# Patient Record
Sex: Female | Born: 1990 | Hispanic: Yes | Marital: Single | Smoking: Never smoker
Health system: Southern US, Community
[De-identification: ages and names within clinical notes are randomized; demographics above are authoritative.]

## PROBLEM LIST (undated history)

## (undated) DIAGNOSIS — O24419 Gestational diabetes mellitus in pregnancy, unspecified control: Secondary | ICD-10-CM

## (undated) DIAGNOSIS — K2970 Gastritis, unspecified, without bleeding: Secondary | ICD-10-CM

## (undated) DIAGNOSIS — Z789 Other specified health status: Secondary | ICD-10-CM

## (undated) HISTORY — PX: NO PAST SURGERIES: SHX2092

## (undated) HISTORY — DX: Gestational diabetes mellitus in pregnancy, unspecified control: O24.419

## (undated) HISTORY — DX: Other specified health status: Z78.9

---

## 2021-12-18 NOTE — L&D Delivery Note (Signed)
OB/GYN Faculty Practice Delivery Note  Kiara Webster is a 31 y.o. G1P1001 s/p SVD at [redacted]w[redacted]d. She was admitted for IOL due to A2GDM.   ROM: 23h 29m with clear fluid GBS Status: Negative  Delivery Date/Time: 05/28/22 at 1813   Delivery: Called to room and patient was complete and pushing. Head delivered LOA. No nuchal cord present. Posterior axilla grasped and rotated counterclockwise. Shoulders and body delivered in usual fashion. Infant initially with poor tone and respiratory effort. Cord clamped x 2 and cut immediately to allow for further resuscitation by NICU team at warmer. Cord blood and cord gases drawn. Placenta delivered spontaneously with gentle cord traction. Fundus firm with massage and Pitocin. Labia, perineum, vagina, and cervix were inspected, and patient was found to have a small 1st degree perineal laceration that was hemostatic and not repaired.   Placenta: Intact, 3 VC - sent to L&D Complications: Chorioamnionitis (treated with Amp/Gent) Lacerations: 1st degree perineal  EBL: 105 cc Analgesia: Epidural   Arterial cord gas: pH 7.19 Venous cord gas: pH 7.27  Infant: Viable female  APGARs 8 and 58  Vilma Meckel, MD OB/GYN Fellow, Faculty Practice

## 2022-01-03 ENCOUNTER — Encounter: Payer: Self-pay | Admitting: Obstetrics

## 2022-01-03 ENCOUNTER — Other Ambulatory Visit (HOSPITAL_COMMUNITY)
Admission: RE | Admit: 2022-01-03 | Discharge: 2022-01-03 | Disposition: A | Discharge status: Home / Self Care | Payer: Medicaid Other | Source: Ambulatory Visit | Attending: Obstetrics | Admitting: Obstetrics

## 2022-01-03 ENCOUNTER — Ambulatory Visit (INDEPENDENT_AMBULATORY_CARE_PROVIDER_SITE_OTHER): Payer: Medicaid Other | Admitting: Obstetrics

## 2022-01-03 ENCOUNTER — Other Ambulatory Visit: Payer: Self-pay

## 2022-01-03 VITALS — BP 105/67 | HR 106 | Wt 219.0 lb

## 2022-01-03 DIAGNOSIS — O0990 Supervision of high risk pregnancy, unspecified, unspecified trimester: Secondary | ICD-10-CM | POA: Insufficient documentation

## 2022-01-03 DIAGNOSIS — Z3A18 18 weeks gestation of pregnancy: Secondary | ICD-10-CM

## 2022-01-03 DIAGNOSIS — O99210 Obesity complicating pregnancy, unspecified trimester: Secondary | ICD-10-CM

## 2022-01-03 DIAGNOSIS — Z3480 Encounter for supervision of other normal pregnancy, unspecified trimester: Secondary | ICD-10-CM | POA: Insufficient documentation

## 2022-01-03 DIAGNOSIS — K219 Gastro-esophageal reflux disease without esophagitis: Secondary | ICD-10-CM

## 2022-01-03 MED ORDER — PANTOPRAZOLE SODIUM 40 MG PO TBEC: 40.0000 mg | DELAYED_RELEASE_TABLET | Freq: Every day | ORAL | 5 refills | Status: DC

## 2022-01-03 MED ORDER — VITAFOL ULTRA 29-0.6-0.4-200 MG PO CAPS: 1.0000 | ORAL_CAPSULE | Freq: Every day | ORAL | 4 refills | Status: DC

## 2022-01-03 NOTE — Progress Notes (Signed)
Subjective:    Kiara Webster is being seen today for her first obstetrical visit.  This is a planned pregnancy. She is at [redacted]w[redacted]d gestation. Her obstetrical history is significant for obesity. Relationship with FOB: significant other, living together. Patient does intend to breast feed. Pregnancy history fully reviewed.  The information documented in the HPI was reviewed and verified.  Menstrual History: OB History     Gravida  1   Para      Term      Preterm      AB      Living         SAB      IAB      Ectopic      Multiple      Live Births               Patient's last menstrual period was 08/27/2021.    History reviewed. No pertinent past medical history.  History reviewed. No pertinent surgical history.  (Not in a hospital admission)  Not on File  Social History   Tobacco Use   Smoking status: Never   Smokeless tobacco: Never  Substance Use Topics   Alcohol use: Not Currently    History reviewed. No pertinent family history.   Review of Systems Constitutional: negative for weight loss Gastrointestinal: negative for vomiting Genitourinary:negative for genital lesions and vaginal discharge and dysuria Musculoskeletal:negative for back pain Behavioral/Psych: negative for abusive relationship, depression, illegal drug usage and tobacco use    Objective:    BP 105/67    Pulse (!) 106    Wt 219 lb (99.3 kg)    LMP 08/27/2021  General Appearance:    Alert, cooperative, no distress, appears stated age  Head:    Normocephalic, without obvious abnormality, atraumatic  Eyes:    PERRL, conjunctiva/corneas clear, EOM's intact, fundi    benign, both eyes  Ears:    Normal TM's and external ear canals, both ears  Nose:   Nares normal, septum midline, mucosa normal, no drainage    or sinus tenderness  Throat:   Lips, mucosa, and tongue normal; teeth and gums normal  Neck:   Supple, symmetrical, trachea midline, no adenopathy;    thyroid:  no  enlargement/tenderness/nodules; no carotid   bruit or JVD  Back:     Symmetric, no curvature, ROM normal, no CVA tenderness  Lungs:     Clear to auscultation bilaterally, respirations unlabored  Chest Wall:    No tenderness or deformity   Heart:    Regular rate and rhythm, S1 and S2 normal, no murmur, rub   or gallop  Breast Exam:    No tenderness, masses, or nipple abnormality  Abdomen:     Soft, non-tender, bowel sounds active all four quadrants,    no masses, no organomegaly  Genitalia:    Normal female without lesion, discharge or tenderness  Extremities:   Extremities normal, atraumatic, no cyanosis or edema  Pulses:   2+ and symmetric all extremities  Skin:   Skin color, texture, turgor normal, no rashes or lesions  Lymph nodes:   Cervical, supraclavicular, and axillary nodes normal  Neurologic:   CNII-XII intact, normal strength, sensation and reflexes    throughout      Lab Review Urine pregnancy test Labs reviewed yes Radiologic studies reviewed no  Assessment:    Pregnancy at [redacted]w[redacted]d weeks    Plan:    1. Supervision of other normal pregnancy, antepartum Rx: - Obstetric Panel, Including HIV -  Hepatitis C antibody - Culture, OB Urine - Cytology - PAP( [place]) - Cervicovaginal ancillary only( ) - Genetic Screening - Prenat-Fe Poly-Methfol-FA-DHA (VITAFOL ULTRA) 29-0.6-0.4-200 MG CAPS; Take 1 capsule by mouth daily before breakfast.  Dispense: 90 capsule; Refill: 4 - AFP, Serum, Open Spina Bifida - [place] MFM OB COMP + 14 WK; Future  2. GERD without esophagitis Rx: - pantoprazole (PROTONIX) 40 MG tablet; Take 1 tablet (40 mg total) by mouth daily.  Dispense: 30 tablet; Refill: 5  3. Obesity affecting pregnancy, antepartum    Prenatal vitamins.  Counseling provided regarding continued use of seat belts, cessation of alcohol consumption, smoking or use of illicit drugs; infection precautions i.e., influenza/TDAP immunizations, toxoplasmosis,CMV,  parvovirus, listeria and varicella; workplace safety, exercise during pregnancy; routine dental care, safe medications, sexual activity, hot tubs, saunas, pools, travel, caffeine use, fish and methlymercury, potential toxins, hair treatments, varicose veins Weight gain recommendations per IOM guidelines reviewed: underweight/BMI< 18.5--> gain 28 - 40 lbs; normal weight/BMI 18.5 - 24.9--> gain 25 - 35 lbs; overweight/BMI 25 - 29.9--> gain 15 - 25 lbs; obese/BMI >30->gain  11 - 20 lbs Problem list reviewed and updated. FIRST/CF mutation testing/NIPT/QUAD SCREEN/fragile X/Ashkenazi Jewish population testing/Spinal muscular atrophy discussed: requested. Role of ultrasound in pregnancy discussed; fetal survey: requested. Amniocentesis discussed: not indicated.  Meds ordered this encounter  Medications   Prenat-Fe Poly-Methfol-FA-DHA (VITAFOL ULTRA) 29-0.6-0.4-200 MG CAPS    Sig: Take 1 capsule by mouth daily before breakfast.    Dispense:  90 capsule    Refill:  4   pantoprazole (PROTONIX) 40 MG tablet    Sig: Take 1 tablet (40 mg total) by mouth daily.    Dispense:  30 tablet    Refill:  5   Orders Placed This Encounter  Procedures   Culture, OB Urine   [place] MFM OB COMP + 14 WK    Standing Status:   Future    Standing Expiration Date:   01/03/2023    Order Specific Question:   Reason for Exam (SYMPTOM  OR DIAGNOSIS REQUIRED)    Answer:   anatomy    Order Specific Question:   Preferred Location    Answer:   [facility] Ul[facility]ound   Obstetric Panel, Including HIV   Hepatitis C antibody   Genetic Screening   AFP, Serum, Open Spina Bifida    Order Specific Question:   Is patient insulin dependent?    Answer:   No    Order Specific Question:   Weight (lbs)    Answer:   219    Order Specific Question:   Gestational Age (GA), weeks    Answer:   18.3    Order Specific Question:   Date on which patient was at this [place]    Answer:   01/03/2022    Order Specific Question:   GA Calculation Method     Answer:   LMP    Order Specific Question:   GA Date    Answer:   06/03/2022    Order Specific Question:   Number of fetuses    Answer:   1    Order Specific Question:   Reason for screen    Answer:   OTHER    Follow up in 4 weeks.  I have spent a total of 20 minutes of face-to-face time, excluding clinical staff time, reviewing notes and preparing to see patient, ordering tests and/or medications, and counseling the patient.    Shelly Bombard, MD 01/03/2022 12:22 PM

## 2022-01-03 NOTE — Progress Notes (Signed)
Pt state she has developed a rash with itching since pregnancy. Pt is also having a lot of headaches.   Pt complains of reflux at night.

## 2022-01-04 LAB — CERVICOVAGINAL ANCILLARY ONLY
Bacterial Vaginitis (gardnerella): POSITIVE — AB
Candida Glabrata: NEGATIVE
Candida Vaginitis: NEGATIVE
Chlamydia: NEGATIVE
Comment: NEGATIVE
Comment: NEGATIVE
Comment: NEGATIVE
Comment: NEGATIVE
Comment: NEGATIVE
Comment: NORMAL
Neisseria Gonorrhea: NEGATIVE
Trichomonas: NEGATIVE

## 2022-01-04 LAB — CYTOLOGY - PAP: Diagnosis: NEGATIVE

## 2022-01-05 ENCOUNTER — Other Ambulatory Visit: Payer: Self-pay | Admitting: Obstetrics

## 2022-01-05 DIAGNOSIS — B9689 Other specified bacterial agents as the cause of diseases classified elsewhere: Secondary | ICD-10-CM

## 2022-01-05 LAB — OBSTETRIC PANEL, INCLUDING HIV
Antibody Screen: NEGATIVE
Basophils Absolute: 0.1 10*3/uL (ref 0.0–0.2)
Basos: 1 %
EOS (ABSOLUTE): 0.1 10*3/uL (ref 0.0–0.4)
Eos: 1 %
HIV Screen 4th Generation wRfx: NONREACTIVE
Hematocrit: 38.9 % (ref 34.0–46.6)
Hemoglobin: 12.9 g/dL (ref 11.1–15.9)
Hepatitis B Surface Ag: NEGATIVE
Immature Grans (Abs): 0.1 10*3/uL (ref 0.0–0.1)
Immature Granulocytes: 1 %
Lymphocytes Absolute: 2 10*3/uL (ref 0.7–3.1)
Lymphs: 19 %
MCH: 26.8 pg (ref 26.6–33.0)
MCHC: 33.2 g/dL (ref 31.5–35.7)
MCV: 81 fL (ref 79–97)
Monocytes Absolute: 0.8 10*3/uL (ref 0.1–0.9)
Monocytes: 7 %
Neutrophils Absolute: 7.6 10*3/uL — ABNORMAL HIGH (ref 1.4–7.0)
Neutrophils: 71 %
Platelets: 405 10*3/uL (ref 150–450)
RBC: 4.81 x10E6/uL (ref 3.77–5.28)
RDW: 13.3 % (ref 11.7–15.4)
RPR Ser Ql: NONREACTIVE
Rh Factor: POSITIVE
Rubella Antibodies, IGG: 3.61 index (ref >0.99)
WBC: 10.7 10*3/uL (ref 3.4–10.8)

## 2022-01-05 LAB — AFP, SERUM, OPEN SPINA BIFIDA
AFP MoM: 1.21
AFP Value: 46.1 ng/mL
Gest. Age on Collection Date: 18.4 weeks
Maternal Age At EDD: 30.2 yr
OSBR Risk 1 IN: 6301
Test Results:: NEGATIVE
Weight: 219 [lb_av]

## 2022-01-05 LAB — CULTURE, OB URINE

## 2022-01-05 LAB — HEPATITIS C ANTIBODY: Hep C Virus Ab: <0.1 s/co ratio (ref 0.0–0.9)

## 2022-01-05 LAB — URINE CULTURE, OB REFLEX

## 2022-01-05 MED ORDER — METRONIDAZOLE 500 MG PO TABS: 500.0000 mg | ORAL_TABLET | Freq: Two times a day (BID) | ORAL | 2 refills | Status: DC

## 2022-01-09 ENCOUNTER — Encounter: Payer: Self-pay | Admitting: *Deleted

## 2022-01-09 ENCOUNTER — Other Ambulatory Visit: Payer: Self-pay

## 2022-01-09 ENCOUNTER — Other Ambulatory Visit: Payer: Self-pay | Admitting: *Deleted

## 2022-01-09 ENCOUNTER — Ambulatory Visit: Payer: Medicaid Other | Attending: Obstetrics

## 2022-01-09 ENCOUNTER — Ambulatory Visit: Payer: Medicaid Other | Admitting: *Deleted

## 2022-01-09 ENCOUNTER — Other Ambulatory Visit: Payer: Self-pay | Admitting: Obstetrics

## 2022-01-09 VITALS — BP 119/68 | HR 97

## 2022-01-09 DIAGNOSIS — O0932 Supervision of pregnancy with insufficient antenatal care, second trimester: Secondary | ICD-10-CM | POA: Diagnosis not present

## 2022-01-09 DIAGNOSIS — O99212 Obesity complicating pregnancy, second trimester: Secondary | ICD-10-CM | POA: Insufficient documentation

## 2022-01-09 DIAGNOSIS — Z3A19 19 weeks gestation of pregnancy: Secondary | ICD-10-CM | POA: Insufficient documentation

## 2022-01-09 DIAGNOSIS — Z363 Encounter for antenatal screening for malformations: Secondary | ICD-10-CM | POA: Insufficient documentation

## 2022-01-09 DIAGNOSIS — Z3480 Encounter for supervision of other normal pregnancy, unspecified trimester: Secondary | ICD-10-CM | POA: Diagnosis not present

## 2022-01-09 DIAGNOSIS — R638 Other symptoms and signs concerning food and fluid intake: Secondary | ICD-10-CM

## 2022-01-12 ENCOUNTER — Ambulatory Visit: Payer: Medicaid Other

## 2022-01-16 ENCOUNTER — Encounter: Payer: Self-pay | Admitting: Obstetrics

## 2022-01-17 ENCOUNTER — Other Ambulatory Visit: Payer: Self-pay

## 2022-01-17 DIAGNOSIS — Z148 Genetic carrier of other disease: Secondary | ICD-10-CM

## 2022-01-31 ENCOUNTER — Other Ambulatory Visit: Payer: Self-pay

## 2022-01-31 ENCOUNTER — Encounter: Payer: Self-pay | Admitting: Obstetrics and Gynecology

## 2022-01-31 ENCOUNTER — Ambulatory Visit (INDEPENDENT_AMBULATORY_CARE_PROVIDER_SITE_OTHER): Payer: Medicaid Other | Admitting: Obstetrics and Gynecology

## 2022-01-31 VITALS — BP 128/79 | HR 97 | Wt 224.8 lb

## 2022-01-31 DIAGNOSIS — O43199 Other malformation of placenta, unspecified trimester: Secondary | ICD-10-CM | POA: Insufficient documentation

## 2022-01-31 DIAGNOSIS — Z3480 Encounter for supervision of other normal pregnancy, unspecified trimester: Secondary | ICD-10-CM

## 2022-01-31 MED ORDER — VITAFOL ULTRA 29-0.6-0.4-200 MG PO CAPS: 1.0000 | ORAL_CAPSULE | Freq: Every day | ORAL | 4 refills | Status: DC

## 2022-01-31 MED ORDER — PANTOPRAZOLE SODIUM 40 MG PO TBEC: 40.0000 mg | DELAYED_RELEASE_TABLET | Freq: Every day | ORAL | 3 refills | Status: DC

## 2022-01-31 NOTE — Progress Notes (Signed)
Pt in office for routine prenatal visit. Pt states she has been having some numbness in the arms and hands while sleeping and low back pain but does not have any other complains today.  PHQ9:6 GAD7:2

## 2022-01-31 NOTE — Progress Notes (Signed)
° °  PRENATAL VISIT NOTE  Subjective:  Kiara Webster is a 31 y.o. G1P0 at [redacted]w[redacted]d being seen today for ongoing prenatal care.  She is currently monitored for the following issues for this low-risk pregnancy and has Supervision of other normal pregnancy, antepartum and Marginal insertion of umbilical cord affecting management of mother on their problem list.  Patient reports no complaints.  Contractions: Not present. Vag. Bleeding: None.  Movement: Present. Denies leaking of fluid.   The following portions of the patient's history were reviewed and updated as appropriate: allergies, current medications, past family history, past medical history, past social history, past surgical history and problem list.   Objective:   Vitals:   01/31/22 1507  BP: 128/79  Pulse: 97  Weight: 224 lb 12.8 oz (102 kg)    Fetal Status: Fetal Heart Rate (bpm): 150 Fundal Height: 23 cm Movement: Present     General:  Alert, oriented and cooperative. Patient is in no acute distress.  Skin: Skin is warm and dry. No rash noted.   Cardiovascular: Normal heart rate noted  Respiratory: Normal respiratory effort, no problems with respiration noted  Abdomen: Soft, gravid, appropriate for gestational age.  Pain/Pressure: Absent     Pelvic: Cervical exam deferred        Extremities: Normal range of motion.  Edema: None  Mental Status: Normal mood and affect. Normal behavior. Normal judgment and thought content.   Assessment and Plan:  Pregnancy: G1P0 at [redacted]w[redacted]d 1. Supervision of other normal pregnancy, antepartum Patient is doing well without complaints Third trimester labs and glucola next visit  2. Marginal insertion of umbilical cord affecting management of mother FOllow up growth  Preterm labor symptoms and general obstetric precautions including but not limited to vaginal bleeding, contractions, leaking of fluid and fetal movement were reviewed in detail with the patient. Please refer to After Visit Summary for  other counseling recommendations.   Return in about 4 weeks (around 02/28/2022) for in person, ROB, Low risk, 2 hr glucola next visit.  Future Appointments  Date Time Provider Department Center  02/06/2022  1:30 PM [facility] NURSE [facility]  02/06/2022  1:45 PM [facility] US6 [facility]  02/06/2022  2:30 PM [facility] GENETIC COUNSELING [facility]    Catalina Antigua, MD

## 2022-02-06 ENCOUNTER — Other Ambulatory Visit: Payer: Self-pay

## 2022-02-06 ENCOUNTER — Ambulatory Visit (HOSPITAL_BASED_OUTPATIENT_CLINIC_OR_DEPARTMENT_OTHER): Payer: Medicaid Other

## 2022-02-06 ENCOUNTER — Ambulatory Visit: Payer: Medicaid Other | Admitting: *Deleted

## 2022-02-06 ENCOUNTER — Ambulatory Visit: Payer: Medicaid Other | Attending: Obstetrics

## 2022-02-06 ENCOUNTER — Other Ambulatory Visit: Payer: Self-pay | Admitting: *Deleted

## 2022-02-06 VITALS — BP 109/66 | HR 103 | Ht 61.0 in

## 2022-02-06 DIAGNOSIS — O0932 Supervision of pregnancy with insufficient antenatal care, second trimester: Secondary | ICD-10-CM | POA: Diagnosis present

## 2022-02-06 DIAGNOSIS — E669 Obesity, unspecified: Secondary | ICD-10-CM | POA: Insufficient documentation

## 2022-02-06 DIAGNOSIS — Z3A23 23 weeks gestation of pregnancy: Secondary | ICD-10-CM

## 2022-02-06 DIAGNOSIS — Z315 Encounter for genetic counseling: Secondary | ICD-10-CM

## 2022-02-06 DIAGNOSIS — Z3480 Encounter for supervision of other normal pregnancy, unspecified trimester: Secondary | ICD-10-CM

## 2022-02-06 DIAGNOSIS — O99212 Obesity complicating pregnancy, second trimester: Secondary | ICD-10-CM

## 2022-02-06 DIAGNOSIS — Z3A32 32 weeks gestation of pregnancy: Secondary | ICD-10-CM | POA: Diagnosis not present

## 2022-02-06 DIAGNOSIS — E668 Other obesity: Secondary | ICD-10-CM | POA: Diagnosis not present

## 2022-02-06 DIAGNOSIS — O43192 Other malformation of placenta, second trimester: Secondary | ICD-10-CM

## 2022-02-06 DIAGNOSIS — R638 Other symptoms and signs concerning food and fluid intake: Secondary | ICD-10-CM

## 2022-02-06 DIAGNOSIS — O321XX0 Maternal care for breech presentation, not applicable or unspecified: Secondary | ICD-10-CM | POA: Insufficient documentation

## 2022-02-06 DIAGNOSIS — Z148 Genetic carrier of other disease: Secondary | ICD-10-CM | POA: Insufficient documentation

## 2022-02-06 NOTE — Progress Notes (Signed)
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   EDC: 06/03/2022 LMP: 08/27/2021 Referring Provider:  Brock Bad, MD  EGA: [redacted]w[redacted]d Genetic Counselor: Teena Dunk, MS, CGC  OB Hx: G1P0 Date of Appointment: 02/06/2022  Accompanied by: Father of the pregnancy and Spanish interpreter Face to Face Time: 40 Minutes   Previous Testing Completed: Kiara Webster previously completed Non-Invasive Prenatal Screening (NIPS) in this pregnancy. The result is low risk. This screening significantly reduces the risk that the current pregnancy has Down syndrome, Trisomy 68, Trisomy 25, and common sex chromosome conditions, however, the risk is not zero given the limitations of NIPS. Additionally, there are many genetic conditions that cannot be detected by NIPS.  Kiara Webster previously completed carrier screening. She screened to have an increased chance to be a silent carrier for Spinal Muscular Atrophy (SMA). She screened to not be a carrier for Cystic Fibrosis (CF), alpha thalassemia, and beta hemoglobinopathies. A negative result on carrier screening reduces the likelihood of being a carrier, however, does not entirely rule out the possibility.   Medical & Family History:  Reports she takes prenatal vitamins. Reports a UTI in this pregnancy which was treated.  Denies personal history of diabetes, high blood pressure, thyroid conditions, and seizures. Denies bleeding and fevers in this pregnancy. Denies using tobacco, alcohol, or street drugs in this pregnancy. Kiara Webster denied a family history of chromosome conditions, intellectual disability, autism, birth defects, bone/skeletal disorders, blindness, deafness, blood disorders, neuromuscular disorders, still births, early infant deaths, and 3 or more pregnancy losses for one person on her prenatal intake questionnaire.      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 SMA is an autosomal recessive disease caused by loss of function mutations in the SMN1 gene. Kiara Webster screened to have two functional copies of the SMN1 gene, however, due to limitations of genetic screening the laboratory cannot confirm if Kiara Webster's two copies are on the same chromosome or on opposite chromosomes. The laboratory identified the variant [id number] in Kiara Webster's screening, meaning there is increased risk for Kiara Webster's two copies of the SMN1 gene to be on the same chromosome. Specifically, given Kiara Webster's ethnic background, her risk to be a silent carrier is approximately 1 in 4. Genetic counseling reviewed with Kiara Webster that if her two copies are on the same chromosome that means her other chromosome would have zero copies, and there could be risk for an affected pregnancy if her reproductive partner is found to be a carrier for SMA. Individuals with SMA have progressive, proximal, and symmetrical muscle weakness and atrophy due to degeneration of motor neurons of the spine and brainstem. Symptoms can first appear at any time between before birth to adulthood. There are five types of SMA that are historically classified based on clinical presentation, onset of symptoms, and the maximum skill level of motor milestones achieved, however, there is a lot of overlap between types. There are several treatments available for individuals affected with SMA and studies show that treatment is most effective when it is started in the first few months of life. Given Kiara Webster's carrier screening result, genetic counseling recommended screening her reproductive partner for SMA. Her reproductive partner verbalized understanding of the information discussed and declined carrier screening for SMA. SMA is included in [place]'s newborn screening program.    Patient Plan:  Proceed with: Routine prenatal care Informed consent was obtained. All questions were answered.  Declined: Carrier screening for SMA for  Kiara Webster's reproductive partner    Thank you for sharing in the care of Kiara Webster with [place].  Please do not hesitate to contact us if you have any questions.  Teena Dunk, MS, [facility]

## 2022-02-28 ENCOUNTER — Other Ambulatory Visit: Payer: Self-pay

## 2022-02-28 ENCOUNTER — Ambulatory Visit (INDEPENDENT_AMBULATORY_CARE_PROVIDER_SITE_OTHER): Payer: Medicaid Other | Admitting: Advanced Practice Midwife

## 2022-02-28 ENCOUNTER — Other Ambulatory Visit: Payer: Medicaid Other

## 2022-02-28 VITALS — BP 123/75 | HR 96 | Wt 224.0 lb

## 2022-02-28 DIAGNOSIS — Z3480 Encounter for supervision of other normal pregnancy, unspecified trimester: Secondary | ICD-10-CM

## 2022-02-28 DIAGNOSIS — Z23 Encounter for immunization: Secondary | ICD-10-CM

## 2022-02-28 DIAGNOSIS — Z3A26 26 weeks gestation of pregnancy: Secondary | ICD-10-CM

## 2022-02-28 DIAGNOSIS — O99352 Diseases of the nervous system complicating pregnancy, second trimester: Secondary | ICD-10-CM

## 2022-02-28 DIAGNOSIS — G5600 Carpal tunnel syndrome, unspecified upper limb: Secondary | ICD-10-CM

## 2022-02-28 DIAGNOSIS — K219 Gastro-esophageal reflux disease without esophagitis: Secondary | ICD-10-CM

## 2022-02-28 MED ORDER — WRIST BRACE//LEFT LARGE MISC: 1.0000 | Freq: Every day | 0 refills | Status: DC

## 2022-02-28 MED ORDER — FAMOTIDINE 40 MG PO TABS: 40.0000 mg | ORAL_TABLET | Freq: Every day | ORAL | 2 refills | Status: DC

## 2022-02-28 MED ORDER — WRIST BRACE/RIGHT LARGE MISC: 1.0000 | Freq: Every day | 0 refills | Status: DC

## 2022-02-28 NOTE — Progress Notes (Signed)
? ?  PRENATAL VISIT NOTE ? ?Subjective:  ?Kiara Webster is a 31 y.o. G1P0 at [redacted]w[redacted]d being seen today for ongoing prenatal care.  She is currently monitored for the following issues for this low-risk pregnancy and has Supervision of other normal pregnancy, antepartum and Marginal insertion of umbilical cord affecting management of mother on their problem list. ? ?Patient reports  tingling/numbness in both hands in the morning and worsening reflux causing sensation in her throat at night .  Contractions: Not present. Vag. Bleeding: None.  Movement: Present. Denies leaking of fluid.  ? ?The following portions of the patient's history were reviewed and updated as appropriate: allergies, current medications, past family history, past medical history, past social history, past surgical history and problem list.  ? ?Objective:  ? ?Vitals:  ? 02/28/22 0820  ?BP: 123/75  ?Pulse: 96  ?Weight: 224 lb (101.6 kg)  ? ? ?Fetal Status: Fetal Heart Rate (bpm): 149   Movement: Present    ? ?General:  Alert, oriented and cooperative. Patient is in no acute distress.  ?Skin: Skin is warm and dry. No rash noted.   ?Cardiovascular: Normal heart rate noted  ?Respiratory: Normal respiratory effort, no problems with respiration noted  ?Abdomen: Soft, gravid, appropriate for gestational age.  Pain/Pressure: Absent     ?Pelvic: Cervical exam deferred        ?Extremities: Normal range of motion.  Edema: Trace  ?Mental Status: Normal mood and affect. Normal behavior. Normal judgment and thought content.  ? ?Assessment and Plan:  ?Pregnancy: G1P0 at [redacted]w[redacted]d ?1. Supervision of other normal pregnancy, antepartum ?--Anticipatory guidance about next visits/weeks of pregnancy given.  ? ?- Glucose Tolerance, 2 Hours w/1 Hour ?- RPR ?- CBC ?- HIV antibody (with reflex) ?- Tdap vaccine greater than or equal to 7yo IM ? ?2. [redacted] weeks gestation of pregnancy ? ? ?3. Pregnancy related carpal tunnel syndrome in second trimester ? ?- Elastic Bandages & Supports  (WRIST BRACE//LEFT LARGE) MISC; 1 Device by Does not apply route daily.  Dispense: 1 each; Refill: 0 ?- Elastic Bandages & Supports (WRIST BRACE/RIGHT LARGE) MISC; 1 Device by Does not apply route daily.  Dispense: 1 each; Refill: 0 ? ?4. Gastroesophageal reflux disease without esophagitis ?--Pt with hx H pylori and taking daily Protonix in the am ?--Add Pepcid Q HS for short course, can take PRN if symptoms improve ?--Discussed dietary changes, and pregnancy related changes to reflux ?- famotidine (PEPCID) 40 MG tablet; Take 1 tablet (40 mg total) by mouth daily.  Dispense: 30 tablet; Refill: 2  ? ?Preterm labor symptoms and general obstetric precautions including but not limited to vaginal bleeding, contractions, leaking of fluid and fetal movement were reviewed in detail with the patient. ?Please refer to After Visit Summary for other counseling recommendations.  ? ?Return in about 2 weeks (around 03/14/2022) for LOB, Any provider. ? ?Future Appointments  ?Date Time Provider Department Center  ?03/13/2022  3:30 PM [facility] NURSE [facility]  ?03/13/2022  3:45 PM [facility] US4 [facility]  ?03/14/2022  3:30 PM [place], Wilmer Floor, CNM [facility] None  ? ? ?Sharen Counter, CNM  ?

## 2022-02-28 NOTE — Progress Notes (Signed)
ROB, c/o numbness in hands at night.   ?TDAP vaccine given LD, tolerated well. ?

## 2022-03-01 LAB — CBC
Hematocrit: 34.7 % (ref 34.0–46.6)
Hemoglobin: 11.2 g/dL (ref 11.1–15.9)
MCH: 26.2 pg — ABNORMAL LOW (ref 26.6–33.0)
MCHC: 32.3 g/dL (ref 31.5–35.7)
MCV: 81 fL (ref 79–97)
Platelets: 386 10*3/uL (ref 150–450)
RBC: 4.27 x10E6/uL (ref 3.77–5.28)
RDW: 13.3 % (ref 11.7–15.4)
WBC: 12.8 10*3/uL — ABNORMAL HIGH (ref 3.4–10.8)

## 2022-03-01 LAB — GLUCOSE TOLERANCE, 2 HOURS W/ 1HR
Glucose, 1 hour: 176 mg/dL (ref 70–179)
Glucose, 2 hour: 131 mg/dL (ref 70–152)
Glucose, Fasting: 99 mg/dL — ABNORMAL HIGH (ref 70–91)

## 2022-03-01 LAB — HIV ANTIBODY (ROUTINE TESTING W REFLEX): HIV Screen 4th Generation wRfx: NONREACTIVE

## 2022-03-01 LAB — RPR: RPR Ser Ql: NONREACTIVE

## 2022-03-03 ENCOUNTER — Other Ambulatory Visit: Payer: Self-pay | Admitting: *Deleted

## 2022-03-03 DIAGNOSIS — O24419 Gestational diabetes mellitus in pregnancy, unspecified control: Secondary | ICD-10-CM

## 2022-03-03 MED ORDER — ACCU-CHEK GUIDE VI STRP: ORAL_STRIP | 12 refills | Status: DC

## 2022-03-03 MED ORDER — ACCU-CHEK SOFTCLIX LANCETS MISC: 12 refills | Status: DC

## 2022-03-03 MED ORDER — ACCU-CHEK GUIDE ME W/DEVICE KIT: PACK | 0 refills | Status: DC

## 2022-03-13 ENCOUNTER — Ambulatory Visit: Payer: Medicaid Other | Admitting: *Deleted

## 2022-03-13 ENCOUNTER — Ambulatory Visit: Payer: Medicaid Other | Attending: Obstetrics and Gynecology | Admitting: Obstetrics and Gynecology

## 2022-03-13 ENCOUNTER — Encounter: Payer: Self-pay | Admitting: *Deleted

## 2022-03-13 ENCOUNTER — Ambulatory Visit: Payer: Medicaid Other | Attending: Obstetrics and Gynecology

## 2022-03-13 ENCOUNTER — Other Ambulatory Visit: Payer: Self-pay

## 2022-03-13 VITALS — BP 123/61 | HR 95

## 2022-03-13 DIAGNOSIS — Z3A28 28 weeks gestation of pregnancy: Secondary | ICD-10-CM | POA: Diagnosis not present

## 2022-03-13 DIAGNOSIS — Z3480 Encounter for supervision of other normal pregnancy, unspecified trimester: Secondary | ICD-10-CM | POA: Insufficient documentation

## 2022-03-13 DIAGNOSIS — O99213 Obesity complicating pregnancy, third trimester: Secondary | ICD-10-CM

## 2022-03-13 DIAGNOSIS — O0933 Supervision of pregnancy with insufficient antenatal care, third trimester: Secondary | ICD-10-CM | POA: Diagnosis not present

## 2022-03-13 DIAGNOSIS — O43199 Other malformation of placenta, unspecified trimester: Secondary | ICD-10-CM | POA: Insufficient documentation

## 2022-03-13 DIAGNOSIS — O43193 Other malformation of placenta, third trimester: Secondary | ICD-10-CM

## 2022-03-13 DIAGNOSIS — O43123 Velamentous insertion of umbilical cord, third trimester: Secondary | ICD-10-CM | POA: Diagnosis not present

## 2022-03-13 DIAGNOSIS — O24419 Gestational diabetes mellitus in pregnancy, unspecified control: Secondary | ICD-10-CM | POA: Diagnosis not present

## 2022-03-13 DIAGNOSIS — O99212 Obesity complicating pregnancy, second trimester: Secondary | ICD-10-CM | POA: Insufficient documentation

## 2022-03-13 DIAGNOSIS — O43192 Other malformation of placenta, second trimester: Secondary | ICD-10-CM | POA: Insufficient documentation

## 2022-03-13 DIAGNOSIS — E669 Obesity, unspecified: Secondary | ICD-10-CM

## 2022-03-13 DIAGNOSIS — O285 Abnormal chromosomal and genetic finding on antenatal screening of mother: Secondary | ICD-10-CM | POA: Diagnosis not present

## 2022-03-13 NOTE — Progress Notes (Signed)
Maternal-Fetal Medicine  ? ?Name: Kiara Webster ?DOB: Jan 17, 1991 ?MRN: [id number] ?Referring Provider: Coral Ceo, MD  ? ?I had the pleasure of seeing [name] [facility]. She is G1 P0 at 28-weeks' gestation and is here for fetal growth assessment. ? ?Patient has a recent diagnosis of gestational diabetes.  Patient will be meeting with her diabetic educator next week.  She has just started checking her blood glucose in the fasting level is 97 mg/DL and postprandial 237 mg/DL. ? ?Marginal cord insertion was seen at previous ultrasound. ? ?Ultrasound ?Fetal growth is appropriate for gestational age.  Amniotic fluid is normal and good fetal activity seen. ?We reassured the patient of the findings. ? ?Gestational diabetes ?I explained the diagnosis of gestational diabetes.  I emphasized the importance of good blood glucose control to prevent adverse fetal or neonatal outcomes.  I discussed blood glucose normal values. I encouraged her to check her blood glucose regularly. ?Possible complications of gestational diabetes include fetal macrosomia, shoulder dystocia and birth injuries, stillbirth (in poorly controlled diabetes) and neonatal respiratory syndrome and other complications. ? ?In about 85% of cases, gestational diabetes is well controlled by diet alone.  Exercise reduces the need for insulin.  Medical treatment includes oral hypoglycemics or insulin. ? ?Timing of delivery: In well-controlled diabetes on diet, patient can be delivered at 3- or 40-weeks' gestation. Vaginal delivery is not contraindicated. ?Type 2 diabetes develops in up to 50% of women with GDM. I recommend postpartum screening with 75-g glucose load at 6 to 12 weeks after delivery. ? ?Recommendations ?-An appointment was made for her to return in 4 weeks for fetal growth assessment. ?-Weekly BPP from [redacted] weeks gestation if patient requires metformin or insulin. ?-Delivery at [redacted] weeks gestation provider diabetes  is well controlled. ? ?Thank you for consultation.  If you have any questions or concerns, please contact me the [facility].  Consultation including face-to-face counseling 30 minutes. ? ? ? ? ?

## 2022-03-14 ENCOUNTER — Other Ambulatory Visit: Payer: Self-pay | Admitting: *Deleted

## 2022-03-14 ENCOUNTER — Ambulatory Visit (INDEPENDENT_AMBULATORY_CARE_PROVIDER_SITE_OTHER): Payer: Medicaid Other | Admitting: Advanced Practice Midwife

## 2022-03-14 VITALS — BP 115/62 | HR 92 | Wt 225.6 lb

## 2022-03-14 DIAGNOSIS — O24419 Gestational diabetes mellitus in pregnancy, unspecified control: Secondary | ICD-10-CM

## 2022-03-14 DIAGNOSIS — L299 Pruritus, unspecified: Secondary | ICD-10-CM

## 2022-03-14 DIAGNOSIS — O0993 Supervision of high risk pregnancy, unspecified, third trimester: Secondary | ICD-10-CM

## 2022-03-14 DIAGNOSIS — Z6841 Body Mass Index (BMI) 40.0 and over, adult: Secondary | ICD-10-CM

## 2022-03-14 DIAGNOSIS — O43199 Other malformation of placenta, unspecified trimester: Secondary | ICD-10-CM

## 2022-03-14 DIAGNOSIS — Z3A28 28 weeks gestation of pregnancy: Secondary | ICD-10-CM

## 2022-03-14 DIAGNOSIS — O24415 Gestational diabetes mellitus in pregnancy, controlled by oral hypoglycemic drugs: Secondary | ICD-10-CM

## 2022-03-14 DIAGNOSIS — O43193 Other malformation of placenta, third trimester: Secondary | ICD-10-CM

## 2022-03-14 DIAGNOSIS — O2686 Pruritic urticarial papules and plaques of pregnancy (PUPPP): Secondary | ICD-10-CM

## 2022-03-14 DIAGNOSIS — O24410 Gestational diabetes mellitus in pregnancy, diet controlled: Secondary | ICD-10-CM

## 2022-03-14 MED ORDER — METFORMIN HCL 500 MG PO TABS: 500.0000 mg | ORAL_TABLET | Freq: Every evening | ORAL | 2 refills | Status: DC

## 2022-03-14 MED ORDER — BETAMETHASONE VALERATE 0.1 % EX CREA: TOPICAL_CREAM | Freq: Two times a day (BID) | CUTANEOUS | 0 refills | Status: DC

## 2022-03-14 NOTE — Progress Notes (Signed)
? ?PRENATAL VISIT NOTE ? ?Subjective:  ?Kiara Webster is a 31 y.o. G1P0 at [redacted]w[redacted]d being seen today for ongoing prenatal care.  She is currently monitored for the following issues for this low-risk pregnancy and has Supervision of high risk pregnancy, antepartum; Marginal insertion of umbilical cord affecting management of mother; and Gestational diabetes mellitus (GDM) controlled on oral hypoglycemic drug on their problem list. ? ?Patient reports occasional contractions.  Contractions: Not present. Vag. Bleeding: None.  Movement: Present. Denies leaking of fluid.  ? ?The following portions of the patient's history were reviewed and updated as appropriate: allergies, current medications, past family history, past medical history, past social history, past surgical history and problem list.  ? ?Objective:  ? ?Vitals:  ? 03/14/22 1516  ?BP: 115/62  ?Pulse: 92  ?Weight: 225 lb 9.6 oz (102.3 kg)  ? ? ?Fetal Status: Fetal Heart Rate (bpm): 143 Fundal Height: 29 cm Movement: Present    ? ?General:  Alert, oriented and cooperative. Patient is in no acute distress.  ?Skin: Skin is warm and dry. No rash noted.   ?Cardiovascular: Normal heart rate noted  ?Respiratory: Normal respiratory effort, no problems with respiration noted  ?Abdomen: Soft, gravid, appropriate for gestational age.  Pain/Pressure: Absent     ?Pelvic: Cervical exam deferred        ?Extremities: Normal range of motion.  Edema: Trace  ?Mental Status: Normal mood and affect. Normal behavior. Normal judgment and thought content.  ? ?Assessment and Plan:  ?Pregnancy: G1P0 at [redacted]w[redacted]d ?1. Supervision of other normal pregnancy, antepartum ?--Anticipatory guidance about next visits/weeks of pregnancy given.  ?--Change to high risk with Metformin Rx today ? ?2. Marginal insertion of umbilical cord affecting management of mother ?--Growth [place] ? ?3. Itching ?--See PUPP below ? ?4. Diet controlled gestational diabetes mellitus (GDM) in third trimester ?--Pt started taking  glucose 4 days ago and has not had diabetes class. ?--Fasting values 3 out of 4 elevated, ranging 93-101.  Pt taking postprandial values at 1 hour and again at 2 hours, so some 1 hours above 140 but 2 hours all below 120. ?--Discussed options, and given that all fasting values are elevated, will start Metformin at night.  F/U in 1 week with more blood sugars to review.  Encouraged pt to continue fasting checks and add EITHER 1 hour or 2 hour checks but not both so we can review at next visit.  ?--Keep appt for diabetes education ?- metFORMIN (GLUCOPHAGE) 500 MG tablet; Take 1 tablet (500 mg total) by mouth at bedtime.  Dispense: 30 tablet; Refill: 2 ? ?5. PUPP (pruritic urticarial papules and plaques of pregnancy) ?--visible rash on abdomen, upper thighs, arms with itching ?--No itching without rash, no itching on palms or soles of feet ?--may use OTC Hydrocortisone but Rx for stronger topical steroid PRN ?- betamethasone valerate (VALISONE) 0.1 % cream; Apply topically 2 (two) times daily.  Dispense: 30 g; Refill: 0 ? ? ?Preterm labor symptoms and general obstetric precautions including but not limited to vaginal bleeding, contractions, leaking of fluid and fetal movement were reviewed in detail with the patient. ?Please refer to After Visit Summary for other counseling recommendations.  ? ?Return for HROB, Any provider. ? ?Future Appointments  ?Date Time Provider [place]  ?03/20/2022  3:50 PM Constant, Vickii Chafe, MD [place] None  ?03/21/2022  4:15 PM [facility]  ?04/10/2022  3:30 PM [facility] NURSE [facility]  ?04/10/2022  3:45 PM [facility] US5 [facility]  ? ? ?Fatima Blank,  CNM  ?

## 2022-03-14 NOTE — Progress Notes (Signed)
Pt in office for [place] visit. Pt c/o of itching all over her body at least once a week.  ?

## 2022-03-20 ENCOUNTER — Encounter: Payer: Self-pay | Admitting: Obstetrics and Gynecology

## 2022-03-20 ENCOUNTER — Ambulatory Visit (INDEPENDENT_AMBULATORY_CARE_PROVIDER_SITE_OTHER): Payer: Medicaid Other | Admitting: Obstetrics and Gynecology

## 2022-03-20 VITALS — BP 126/82 | HR 105 | Wt 228.0 lb

## 2022-03-20 DIAGNOSIS — O24415 Gestational diabetes mellitus in pregnancy, controlled by oral hypoglycemic drugs: Secondary | ICD-10-CM

## 2022-03-20 DIAGNOSIS — O43199 Other malformation of placenta, unspecified trimester: Secondary | ICD-10-CM

## 2022-03-20 DIAGNOSIS — O0990 Supervision of high risk pregnancy, unspecified, unspecified trimester: Secondary | ICD-10-CM

## 2022-03-20 NOTE — Progress Notes (Signed)
? ?  PRENATAL VISIT NOTE ? ?Subjective:  ?Kiara Webster is a 31 y.o. G1P0 at [redacted]w[redacted]d being seen today for ongoing prenatal care.  She is currently monitored for the following issues for this high-risk pregnancy and has Supervision of high risk pregnancy, antepartum; Marginal insertion of umbilical cord affecting management of mother; and Gestational diabetes mellitus (GDM) controlled on oral hypoglycemic drug on their problem list. ? ?Patient reports no complaints.  Contractions: Not present. Vag. Bleeding: None.  Movement: Present. Denies leaking of fluid.  ? ?The following portions of the patient's history were reviewed and updated as appropriate: allergies, current medications, past family history, past medical history, past social history, past surgical history and problem list.  ? ?Objective:  ? ?Vitals:  ? 03/20/22 1552  ?BP: 126/82  ?Pulse: (!) 105  ?Weight: 228 lb (103.4 kg)  ? ? ?Fetal Status: Fetal Heart Rate (bpm): 141   Movement: Present    ? ?General:  Alert, oriented and cooperative. Patient is in no acute distress.  ?Skin: Skin is warm and dry. No rash noted.   ?Cardiovascular: Normal heart rate noted  ?Respiratory: Normal respiratory effort, no problems with respiration noted  ?Abdomen: Soft, gravid, appropriate for gestational age.  Pain/Pressure: Present     ?Pelvic: Cervical exam deferred        ?Extremities: Normal range of motion.  Edema: Trace  ?Mental Status: Normal mood and affect. Normal behavior. Normal judgment and thought content.  ? ?Assessment and Plan:  ?Pregnancy: G1P0 at [redacted]w[redacted]d ?1. Supervision of high risk pregnancy, antepartum ?Patient is doing well without complaints ? ?2. Gestational diabetes mellitus (GDM) in third trimester controlled on oral hypoglycemic drug ?CBGS reviewed and all values since starting metformin within range ?Continue 500 mg qHS ?Follow up growth ultrasound ? ?3. Marginal insertion of umbilical cord affecting management of mother ? ? ?Preterm labor symptoms and  general obstetric precautions including but not limited to vaginal bleeding, contractions, leaking of fluid and fetal movement were reviewed in detail with the patient. ?Please refer to After Visit Summary for other counseling recommendations.  ? ?Return in about 2 weeks (around 04/03/2022) for in person, ROB, High risk. ? ?Future Appointments  ?Date Time Provider [place]  ?03/21/2022  4:15 PM [facility]-EDUCATION [facility]  ?04/10/2022  3:30 PM [facility] NURSE [facility]  ?04/10/2022  3:45 PM [facility] US5 [facility]  ? ? ?Mora Bellman, MD ? ?

## 2022-03-21 ENCOUNTER — Ambulatory Visit (INDEPENDENT_AMBULATORY_CARE_PROVIDER_SITE_OTHER): Payer: Medicaid Other | Admitting: Registered"

## 2022-03-21 ENCOUNTER — Encounter: Payer: Medicaid Other | Attending: Advanced Practice Midwife | Admitting: Registered"

## 2022-03-21 DIAGNOSIS — O24419 Gestational diabetes mellitus in pregnancy, unspecified control: Secondary | ICD-10-CM | POA: Insufficient documentation

## 2022-03-21 DIAGNOSIS — O24415 Gestational diabetes mellitus in pregnancy, controlled by oral hypoglycemic drugs: Secondary | ICD-10-CM

## 2022-03-21 NOTE — Progress Notes (Signed)
[employer] Video interpreter Jacqulyn Bath [phone] / Barnie Alderman [phone] ? ?Patient was seen for Gestational Diabetes self-management on 03/21/22  ?Start time 1615 and End time 1708  ? ?Estimated due date: 06/17; [redacted]w[redacted]d ? ?Clinical: ?Medications: metformin 500 mg with dinner ?Medical History: reviewed ?Labs: OGTT 99 mg/dL, A1c n/a%  ? ?Dietary and Lifestyle History: ?Pt states after getting diagnosis pt states she started taking medication and because BG were too high. Pt states she started eating less fat and carbs, drinks only water, milk, and coffee. ?  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  ? ?Physical Activity: limited due to back pain ?Stress: not assessed ?Sleep: has a hard time sleeping due to pack pain ? ?24 hr Recall:  ?First Meal: milk, coffee with milk and 1/2 T sugar, 1 pancake ?Snack: fruit ?Second meal: 3 tortillas, cheese, cream, bean, egg, water ?Snack: ?Third meal: 1 tortilla, vegetables, cheese ?Snack: ?Beverages: water, milk, coffee ? ?NUTRITION INTERVENTION  ?Nutrition education (E-1) on the following topics:  ? ?Initial Follow-up ? ?[x]  []  Definition of Gestational Diabetes ?[]  []  Why dietary management is important in controlling blood glucose ?[x]  []  Effects each nutrient has on blood glucose levels ?[]  []  Simple carbohydrates vs complex carbohydrates ?[]  []  Fluid intake ?[x]  []  Creating a balanced meal plan ?[x]  []  Carbohydrate counting  ?[x]  []  When to check blood glucose levels ?[x]  []  Proper blood glucose monitoring techniques ?[x]  []  Effect of stress and stress reduction techniques  ?[x]  []  Exercise effect on blood glucose levels, appropriate exercise during pregnancy - pregnancy belt ?[]  []  Importance of limiting caffeine and abstaining from alcohol and smoking ?[x]  []  Medications used for blood sugar control during pregnancy ?[]  []  Hypoglycemia and rule of 15 ?[x]  []  Postpartum self care ? ?Patient already has a meter, is testing pre breakfast and 2 hours  after each meal. ?FBS: mostly WNL ?Postprandial: ~75% WNL ? ?Patient instructed to monitor glucose levels: ?FBS: 60 - ? 95 mg/dL  ?1 hour: ? 140 mg/dL ?2 hour: ? 120 mg/dL ? ?Patient received handouts: ?Nutrition Diabetes and Pregnancy ?Carbohydrate Counting List ? ?Patient will be seen for follow-up as needed.  ?

## 2022-04-04 ENCOUNTER — Ambulatory Visit (INDEPENDENT_AMBULATORY_CARE_PROVIDER_SITE_OTHER): Payer: Medicaid Other | Admitting: Obstetrics & Gynecology

## 2022-04-04 VITALS — BP 115/76 | HR 103 | Wt 228.0 lb

## 2022-04-04 DIAGNOSIS — O43199 Other malformation of placenta, unspecified trimester: Secondary | ICD-10-CM

## 2022-04-04 DIAGNOSIS — O24415 Gestational diabetes mellitus in pregnancy, controlled by oral hypoglycemic drugs: Secondary | ICD-10-CM

## 2022-04-04 DIAGNOSIS — O0990 Supervision of high risk pregnancy, unspecified, unspecified trimester: Secondary | ICD-10-CM

## 2022-04-04 NOTE — Progress Notes (Signed)
? ?  PRENATAL VISIT NOTE ? ?Subjective:  ?Kiara Webster is a 31 y.o. G1P0 at [redacted]w[redacted]d being seen today for ongoing prenatal care.  She is currently monitored for the following issues for this high-risk pregnancy and has Supervision of high risk pregnancy, antepartum; Marginal insertion of umbilical cord affecting management of mother; and Gestational diabetes mellitus (GDM) controlled on oral hypoglycemic drug on their problem list. ? ?Patient reports no complaints.  Contractions: Not present. Vag. Bleeding: None.  Movement: Present. Denies leaking of fluid.  ? ?The following portions of the patient's history were reviewed and updated as appropriate: allergies, current medications, past family history, past medical history, past social history, past surgical history and problem list.  ? ?Objective:  ? ?Vitals:  ? 04/04/22 1510  ?BP: 115/76  ?Pulse: (!) 103  ?Weight: 228 lb (103.4 kg)  ? ? ?Fetal Status: Fetal Heart Rate (bpm): 145   Movement: Present    ? ?General:  Alert, oriented and cooperative. Patient is in no acute distress.  ?Skin: Skin is warm and dry. No rash noted.   ?Cardiovascular: Normal heart rate noted  ?Respiratory: Normal respiratory effort, no problems with respiration noted  ?Abdomen: Soft, gravid, appropriate for gestational age.  Pain/Pressure: Present     ?Pelvic: Cervical exam deferred        ?Extremities: Normal range of motion.  Edema: Trace  ?Mental Status: Normal mood and affect. Normal behavior. Normal judgment and thought content.  ? ?Assessment and Plan:  ?Pregnancy: G1P0 at [redacted]w[redacted]d ?1. Gestational diabetes mellitus (GDM) in third trimester controlled on oral hypoglycemic drug ?FBS <95 and PP 75%<120 ? ?2. Marginal insertion of umbilical cord affecting management of mother ?F/u US 1 week ? ?3. Supervision of high risk pregnancy, antepartum ?BPP weekly after 32 weeks ? ?Preterm labor symptoms and general obstetric precautions including but not limited to vaginal bleeding, contractions, leaking  of fluid and fetal movement were reviewed in detail with the patient. ?Please refer to After Visit Summary for other counseling recommendations.  ? ?Return in about 2 weeks (around 04/18/2022). ? ?Future Appointments  ?Date Time Provider [place]  ?04/10/2022  3:30 PM [facility] NURSE [facility]  ?04/10/2022  3:45 PM [facility] US5 [facility]  ? ? ?Emeterio Reeve, MD ? ?

## 2022-04-04 NOTE — Progress Notes (Signed)
ROB 31.[redacted] wks GA ?GDM: has CBG log ?

## 2022-04-10 ENCOUNTER — Ambulatory Visit: Payer: Medicaid Other | Attending: Obstetrics and Gynecology

## 2022-04-10 ENCOUNTER — Encounter: Payer: Self-pay | Admitting: *Deleted

## 2022-04-10 ENCOUNTER — Ambulatory Visit: Payer: Medicaid Other | Admitting: *Deleted

## 2022-04-10 VITALS — BP 121/62 | HR 98

## 2022-04-10 DIAGNOSIS — O24415 Gestational diabetes mellitus in pregnancy, controlled by oral hypoglycemic drugs: Secondary | ICD-10-CM

## 2022-04-10 DIAGNOSIS — O0990 Supervision of high risk pregnancy, unspecified, unspecified trimester: Secondary | ICD-10-CM | POA: Insufficient documentation

## 2022-04-10 DIAGNOSIS — E669 Obesity, unspecified: Secondary | ICD-10-CM

## 2022-04-10 DIAGNOSIS — Z148 Genetic carrier of other disease: Secondary | ICD-10-CM

## 2022-04-10 DIAGNOSIS — O43199 Other malformation of placenta, unspecified trimester: Secondary | ICD-10-CM | POA: Diagnosis present

## 2022-04-10 DIAGNOSIS — O0933 Supervision of pregnancy with insufficient antenatal care, third trimester: Secondary | ICD-10-CM

## 2022-04-10 DIAGNOSIS — O285 Abnormal chromosomal and genetic finding on antenatal screening of mother: Secondary | ICD-10-CM

## 2022-04-10 DIAGNOSIS — O43193 Other malformation of placenta, third trimester: Secondary | ICD-10-CM | POA: Insufficient documentation

## 2022-04-10 DIAGNOSIS — O24419 Gestational diabetes mellitus in pregnancy, unspecified control: Secondary | ICD-10-CM | POA: Insufficient documentation

## 2022-04-10 DIAGNOSIS — Z6841 Body Mass Index (BMI) 40.0 and over, adult: Secondary | ICD-10-CM | POA: Diagnosis present

## 2022-04-10 DIAGNOSIS — Z3A31 31 weeks gestation of pregnancy: Secondary | ICD-10-CM

## 2022-04-10 DIAGNOSIS — O99213 Obesity complicating pregnancy, third trimester: Secondary | ICD-10-CM

## 2022-04-11 ENCOUNTER — Other Ambulatory Visit: Payer: Self-pay | Admitting: *Deleted

## 2022-04-11 DIAGNOSIS — Z6841 Body Mass Index (BMI) 40.0 and over, adult: Secondary | ICD-10-CM

## 2022-04-11 DIAGNOSIS — O43193 Other malformation of placenta, third trimester: Secondary | ICD-10-CM

## 2022-04-11 DIAGNOSIS — O24415 Gestational diabetes mellitus in pregnancy, controlled by oral hypoglycemic drugs: Secondary | ICD-10-CM

## 2022-04-13 ENCOUNTER — Other Ambulatory Visit: Payer: Self-pay | Admitting: *Deleted

## 2022-04-13 DIAGNOSIS — O24419 Gestational diabetes mellitus in pregnancy, unspecified control: Secondary | ICD-10-CM

## 2022-04-13 DIAGNOSIS — O43193 Other malformation of placenta, third trimester: Secondary | ICD-10-CM

## 2022-04-13 DIAGNOSIS — O99213 Obesity complicating pregnancy, third trimester: Secondary | ICD-10-CM

## 2022-04-18 ENCOUNTER — Telehealth: Payer: Self-pay

## 2022-04-18 NOTE — Telephone Encounter (Signed)
Breast pump order received from [facility]. ? ?Order signed and faxed back. ? ?Confirmation received. ?

## 2022-04-19 ENCOUNTER — Encounter: Payer: Self-pay | Admitting: Obstetrics and Gynecology

## 2022-04-19 ENCOUNTER — Ambulatory Visit (INDEPENDENT_AMBULATORY_CARE_PROVIDER_SITE_OTHER): Payer: Medicaid Other | Admitting: Obstetrics and Gynecology

## 2022-04-19 VITALS — BP 121/80 | HR 90 | Wt 232.1 lb

## 2022-04-19 DIAGNOSIS — O43199 Other malformation of placenta, unspecified trimester: Secondary | ICD-10-CM

## 2022-04-19 DIAGNOSIS — O24415 Gestational diabetes mellitus in pregnancy, controlled by oral hypoglycemic drugs: Secondary | ICD-10-CM

## 2022-04-19 DIAGNOSIS — O0990 Supervision of high risk pregnancy, unspecified, unspecified trimester: Secondary | ICD-10-CM

## 2022-04-19 NOTE — Progress Notes (Signed)
ROB pt in office with no concerns today.  ?

## 2022-04-19 NOTE — Progress Notes (Signed)
Subjective:  ?Kiara Webster is a 31 y.o. G1P0 at [redacted]w[redacted]d being seen today for ongoing prenatal care.  She is currently monitored for the following issues for this high-risk pregnancy and has Supervision of high risk pregnancy, antepartum; Marginal insertion of umbilical cord affecting management of mother; and Gestational diabetes mellitus (GDM) controlled on oral hypoglycemic drug on their problem list. ? ?Patient reports general discomforts of pregnancy.  Contractions: Not present. Vag. Bleeding: None.  Movement: Present. Denies leaking of fluid.  ? ?The following portions of the patient's history were reviewed and updated as appropriate: allergies, current medications, past family history, past medical history, past social history, past surgical history and problem list. Problem list updated. ? ?Objective:  ? ?Vitals:  ? 04/19/22 1526  ?BP: 121/80  ?Pulse: 90  ?Weight: 232 lb 1.6 oz (105.3 kg)  ? ? ?Fetal Status: Fetal Heart Rate (bpm): 156   Movement: Present    ? ?General:  Alert, oriented and cooperative. Patient is in no acute distress.  ?Skin: Skin is warm and dry. No rash noted.   ?Cardiovascular: Normal heart rate noted  ?Respiratory: Normal respiratory effort, no problems with respiration noted  ?Abdomen: Soft, gravid, appropriate for gestational age. Pain/Pressure: Present     ?Pelvic:  Cervical exam deferred        ?Extremities: Normal range of motion.  Edema: None  ?Mental Status: Normal mood and affect. Normal behavior. Normal judgment and thought content.  ? ?Urinalysis:     ? ?Assessment and Plan:  ?Pregnancy: G1P0 at [redacted]w[redacted]d ? ?1. Marginal insertion of umbilical cord affecting management of mother ?F/U U/S scheduled ? ?2. Supervision of high risk pregnancy, antepartum ?Stable ? ?3. Gestational diabetes mellitus (GDM) in third trimester controlled on oral hypoglycemic drug ?Has not been checking CBG's since mid April as instructed by nutritionist because of sore fingers. All CBG's were in goal range prior  to this. ?Pt instructed starting checking CBG's as before. ?Continue with Metformin ?Serial U/S and antenatal testing as per MFM ? ? ?Live interrupter used during today's visit ? ?Preterm labor symptoms and general obstetric precautions including but not limited to vaginal bleeding, contractions, leaking of fluid and fetal movement were reviewed in detail with the patient. ?Please refer to After Visit Summary for other counseling recommendations.  ?Return in about 2 weeks (around 05/03/2022) for OB visit, face to face, MD only. ? ? ?Chancy Milroy, MD ?

## 2022-04-19 NOTE — Patient Instructions (Signed)
Tercer trimestre de embarazo Third Trimester of Pregnancy  El tercer trimestre de embarazo va desde la semana 28 hasta la semana 40. Tambin se dice que va desde el mes 7 hasta el mes 9. En este trimestre, el beb en gestacin (feto) crece muy rpidamente. Hacia el final del noveno mes, el beb en gestacin mide alrededor de 20 pulgadas (45 cm) de largo. Pesa entre 6 y 10 libras (2,70 y 4,50 kg). Cambios en el cuerpo durante el tercer trimestre Su organismo contina atravesando por muchos cambios durante este perodo. Los cambios varan y generalmente vuelven a la normalidad despus del nacimiento del beb. Cambios fsicos Seguir aumentando de peso. Puede ser que aumente entre 25 y 35 libras (11 y 16 kg) hacia el final del embarazo. Si tiene bajo peso, puede aumentar entre 28 y 40 lb (unos 13 a 18 kg). Si tiene sobrepeso, puede aumentar entre 15 y 25 libras (unos 7 a 11 kg). Podrn aparecer las primeras estras en las caderas, el vientre (abdomen) y las mamas. Las mamas seguirn creciendo y pueden doler. Un lquido amarillo (calostro) puede salir de sus pechos. Esta es la primera leche que usted produce para el beb. Tal vez haya cambios en el cabello. El ombligo puede salir hacia afuera. Puede observar que se le hinchan ms las manos, la cara o los tobillos. Cambios en la salud Es posible que tenga acidez estomacal. Es posible que tenga dificultades para defecar (estreimiento). Pueden aparecerle hemorroides. Estas son venas hinchadas en el ano que pueden picar o doler. Puede comenzar a tener venas hinchadas (vrices) en las piernas. Puede presentar ms dolor en la pelvis, la espalda o los muslos. Puede presentar ms hormigueo o entumecimiento en las manos, los brazos y las piernas. La piel de su vientre tambin puede sentirse entumecida. Es posible que sienta falta de aire a medida que el tero se agranda. Otros cambios Es posible que haga pis (orine) con mayor frecuencia. Puede tener ms  problemas para dormir. Puede notar que el beb en gestacin "baja" o se mueve ms hacia bajo, en el vientre. Puede notar ms secrecin proveniente de la vagina. Puede sentir las articulaciones flojas y puede sentir dolor alrededor del hueso plvico. Siga estas instrucciones en su casa: Medicamentos Use los medicamentos de venta libre y los recetados solamente como se lo haya indicado el mdico. Algunos medicamentos no son seguros durante el embarazo. Tome vitaminas prenatales que contengan por lo menos 600 microgramos (mcg) de cido flico. Comida y bebida Consuma comidas saludables que incluyan lo siguiente: Frutas y verduras frescas. Cereales integrales. Buenas fuentes de protenas, como carne, huevos y tofu. Productos lcteos con bajo contenido de grasa. Evite la carne cruda y el jugo, la leche y el queso sin pasteurizar. Estos portan grmenes que pueden provocar dao tanto a usted como al beb. Tome 4 o 5 comidas pequeas en lugar de 3 comidas abundantes al da. Es posible que deba tomar medidas para prevenir o tratar los problemas para defecar: Beber suficiente lquido para mantener el pis (orina) de color amarillo plido. Come alimentos ricos en fibra. Entre ellos, frijoles, cereales integrales y frutas y verduras frescas. Limitar los alimentos con alto contenido de grasa y azcar. Estos incluyen alimentos fritos o dulces. Actividad Haga ejercicios solamente como se lo haya indicado el mdico. Interrumpa la actividad fsica si comienza a tener clicos en el tero. Evite levantar pesos excesivos. No haga ejercicio si hace demasiado calor, hay demasiada humedad o se encuentra en un lugar de mucha   altura (altitud elevada). Si lo desea, puede continuar teniendo relaciones sexuales, a menos que el mdico le indique lo contrario. Alivio del dolor y del malestar Haga pausas con frecuencia y descanse con las piernas levantadas (elevadas) si tiene calambres en las piernas o dolor en la parte  baja de la espalda. Dese baos de asiento con agua tibia para aliviar el dolor o las molestias causadas por las hemorroides. Use una crema para las hemorroides si el mdico la autoriza. Use un sostn que le brinde buen soporte si sus mamas estn sensibles. Si desarrolla venas hinchadas y abultadas en las piernas: Use medias de compresin segn las indicaciones de su mdico. Levante los pies durante 15 minutos, 3 o 4 veces por da. Limite la sal en sus alimentos. Seguridad Hable con el mdico antes de recorrer largas distancias. No se d baos de inmersin en agua caliente, baos turcos ni saunas. Use el cinturn de seguridad en todo momento mientras vaya en auto. Hable con el mdico si alguien le est haciendo dao o gritando mucho. Preparacin para la llegada del beb Para prepararse para la llegada de su beb: Tome clases prenatales. Visite el hospital y recorra el rea de maternidad. Compre un asiento de seguridad orientado hacia atrs para llevar al beb en el automvil. Aprenda cmo instalarlo en el auto. Prepare la habitacin del beb. Saque todas las almohadas y los animales de peluche de la cuna del beb. Instrucciones generales Evite el contacto con las bandejas sanitarias de los gatos y la tierra que estos animales usan. Estos contienen grmenes que pueden daar al beb y causar la prdida del beb ya sea aborto espontneo o muerte fetal. No se haga duchas vaginales ni use tampones. No use tampones ni toallas higinicas perfumadas. No fume ni consuma ningn producto que contenga nicotina o tabaco. Si necesita ayuda para dejar de fumar, consulte al mdico. No beba alcohol. No use medicamentos a base de hierbas, drogas ilegales, ni medicamentos que el mdico no haya autorizado. Las sustancias qumicas de estos productos pueden afectar al beb. Cumpla con todas las visitas de seguimiento. Esto es importante. Dnde buscar ms informacin American Pregnancy Association (Asociacin  Americana del Embarazo): americanpregnancy.org American College of Obstetricians and Gynecologists (Colegio Estadounidense de Obstetras y Gineclogos): www.acog.org Office on Women's Health (Oficina para la Salud de la Mujer): womenshealth.gov/pregnancy Comunquese con un mdico si: Tiene fiebre. Tiene clicos leves o siente presin en la parte baja del vientre. Sufre un dolor persistente en el abdomen. Vomita o hace deposiciones acuosas (diarrea). Advierte lquido con mal olor que proviene de la vagina. Siente dolor al orinar o hace orina con mal olor. Tiene un dolor de cabeza que no desaparece despus de tomar analgsicos. Nota cambios en la visin o ve manchas delante de los ojos. Solicite ayuda de inmediato si: Rompe la bolsa. Tiene contracciones regulares separadas por menos de 5 minutos. Tiene sangrado o pequeas prdidas vaginales. Tiene clicos o dolor muy intensos en el vientre. Tiene dificultad para respirar. Sientes dolor en el pecho. Se desmaya. No ha sentido al beb moverse durante el tiempo que le indic el mdico. Tiene dolor, hinchazn o enrojecimiento nuevos en un brazo o una pierna o se produce un aumento de alguno de estos sntomas. Resumen El tercer trimestre comprende desde la semana 28 hasta la semana 40 (desde el mes 7 hasta el mes 9). Esta es la poca en que el beb en gestacin crece muy rpidamente. Durante este perodo, las molestias pueden aumentar a medida que usted   sube de peso y el beb crece. Preprese para la llegada del beb: asista a las clases prenatales, compre un asiento de seguridad orientado hacia atrs para llevar al beb en auto y prepare la habitacin del beb. Solicite ayuda de inmediato si tiene sangrado por la vagina, siente dolor en el pecho y tiene dificultad para respirar, o si no ha sentido al beb moverse durante el tiempo que le indic el mdico. Esta informacin no tiene como fin reemplazar el consejo del mdico. Asegrese de hacerle al  mdico cualquier pregunta que tenga. Document Revised: 06/16/2020 Document Reviewed: 06/16/2020 Elsevier Patient Education  2023 [employer].  

## 2022-04-20 ENCOUNTER — Ambulatory Visit: Payer: Medicaid Other | Admitting: *Deleted

## 2022-04-20 ENCOUNTER — Ambulatory Visit: Payer: Medicaid Other | Attending: Obstetrics

## 2022-04-20 VITALS — BP 120/69 | HR 100

## 2022-04-20 DIAGNOSIS — O0990 Supervision of high risk pregnancy, unspecified, unspecified trimester: Secondary | ICD-10-CM | POA: Insufficient documentation

## 2022-04-20 DIAGNOSIS — O43193 Other malformation of placenta, third trimester: Secondary | ICD-10-CM | POA: Diagnosis not present

## 2022-04-20 DIAGNOSIS — Z362 Encounter for other antenatal screening follow-up: Secondary | ICD-10-CM

## 2022-04-20 DIAGNOSIS — O285 Abnormal chromosomal and genetic finding on antenatal screening of mother: Secondary | ICD-10-CM

## 2022-04-20 DIAGNOSIS — E669 Obesity, unspecified: Secondary | ICD-10-CM

## 2022-04-20 DIAGNOSIS — Z6841 Body Mass Index (BMI) 40.0 and over, adult: Secondary | ICD-10-CM | POA: Insufficient documentation

## 2022-04-20 DIAGNOSIS — O99213 Obesity complicating pregnancy, third trimester: Secondary | ICD-10-CM

## 2022-04-20 DIAGNOSIS — Z148 Genetic carrier of other disease: Secondary | ICD-10-CM

## 2022-04-20 DIAGNOSIS — O43199 Other malformation of placenta, unspecified trimester: Secondary | ICD-10-CM | POA: Diagnosis present

## 2022-04-20 DIAGNOSIS — O24415 Gestational diabetes mellitus in pregnancy, controlled by oral hypoglycemic drugs: Secondary | ICD-10-CM

## 2022-04-20 DIAGNOSIS — Z3A33 33 weeks gestation of pregnancy: Secondary | ICD-10-CM

## 2022-04-20 DIAGNOSIS — O0933 Supervision of pregnancy with insufficient antenatal care, third trimester: Secondary | ICD-10-CM | POA: Diagnosis not present

## 2022-04-27 ENCOUNTER — Ambulatory Visit: Payer: Medicaid Other | Admitting: *Deleted

## 2022-04-27 ENCOUNTER — Ambulatory Visit (INDEPENDENT_AMBULATORY_CARE_PROVIDER_SITE_OTHER): Payer: Medicaid Other

## 2022-04-27 DIAGNOSIS — O24415 Gestational diabetes mellitus in pregnancy, controlled by oral hypoglycemic drugs: Secondary | ICD-10-CM

## 2022-04-30 NOTE — Progress Notes (Signed)
Patient seen and assessed by nursing staff.  Agree with documentation and plan. ? ? ?NST:  Baseline: 140 bpm, Variability: Good {> 6 bpm), Accelerations: Reactive, and Decelerations: Absent ? ?

## 2022-05-01 ENCOUNTER — Ambulatory Visit (INDEPENDENT_AMBULATORY_CARE_PROVIDER_SITE_OTHER): Payer: Medicaid Other

## 2022-05-01 ENCOUNTER — Ambulatory Visit: Payer: Medicaid Other | Admitting: *Deleted

## 2022-05-01 VITALS — BP 104/67 | HR 97

## 2022-05-01 DIAGNOSIS — O24415 Gestational diabetes mellitus in pregnancy, controlled by oral hypoglycemic drugs: Secondary | ICD-10-CM

## 2022-05-01 NOTE — Progress Notes (Signed)

## 2022-05-04 ENCOUNTER — Ambulatory Visit (INDEPENDENT_AMBULATORY_CARE_PROVIDER_SITE_OTHER): Payer: Medicaid Other | Admitting: Obstetrics and Gynecology

## 2022-05-04 ENCOUNTER — Encounter: Payer: Self-pay | Admitting: Obstetrics and Gynecology

## 2022-05-04 ENCOUNTER — Other Ambulatory Visit (HOSPITAL_COMMUNITY)
Admission: RE | Admit: 2022-05-04 | Discharge: 2022-05-04 | Disposition: A | Discharge status: Home / Self Care | Payer: Medicaid Other | Source: Ambulatory Visit | Attending: Obstetrics and Gynecology | Admitting: Obstetrics and Gynecology

## 2022-05-04 VITALS — BP 110/74 | HR 105 | Wt 230.0 lb

## 2022-05-04 DIAGNOSIS — O43199 Other malformation of placenta, unspecified trimester: Secondary | ICD-10-CM

## 2022-05-04 DIAGNOSIS — O24415 Gestational diabetes mellitus in pregnancy, controlled by oral hypoglycemic drugs: Secondary | ICD-10-CM

## 2022-05-04 DIAGNOSIS — O0990 Supervision of high risk pregnancy, unspecified, unspecified trimester: Secondary | ICD-10-CM | POA: Diagnosis present

## 2022-05-04 DIAGNOSIS — O2686 Pruritic urticarial papules and plaques of pregnancy (PUPPP): Secondary | ICD-10-CM

## 2022-05-04 DIAGNOSIS — Z3A36 36 weeks gestation of pregnancy: Secondary | ICD-10-CM

## 2022-05-04 MED ORDER — BETAMETHASONE VALERATE 0.1 % EX CREA: TOPICAL_CREAM | Freq: Two times a day (BID) | CUTANEOUS | 1 refills | Status: DC

## 2022-05-04 NOTE — Addendum Note (Signed)
Addended by: Maretta Bees on: 05/04/2022 04:29 PM   Modules accepted: Orders

## 2022-05-04 NOTE — Patient Instructions (Addendum)
Tercer trimestre de embarazo Third Trimester of Pregnancy  El tercer trimestre de embarazo va desde la semana 28 hasta la semana 40. Tambin se dice que va desde el mes 7 hasta el mes 9. En este trimestre, el beb en gestacin (feto) crece muy rpidamente. Hacia el final del noveno mes, el beb en gestacin mide alrededor de 20 pulgadas (45 cm) de largo. Pesa entre 6 y 10 libras (2,70 y 4,50 kg). Cambios en el cuerpo durante el tercer trimestre Su organismo contina atravesando por muchos cambios durante este perodo. Los cambios varan y generalmente vuelven a la normalidad despus del nacimiento del beb. Cambios fsicos Seguir aumentando de peso. Puede ser que aumente entre 25 y 35 libras (11 y 16 kg) hacia el final del embarazo. Si tiene bajo peso, puede aumentar entre 28 y 40 lb (unos 13 a 18 kg). Si tiene sobrepeso, puede aumentar entre 15 y 25 libras (unos 7 a 11 kg). Podrn aparecer las primeras estras en las caderas, el vientre (abdomen) y las mamas. Las mamas seguirn creciendo y pueden doler. Un lquido amarillo (calostro) puede salir de sus pechos. Esta es la primera leche que usted produce para el beb. Tal vez haya cambios en el cabello. El ombligo puede salir hacia afuera. Puede observar que se le hinchan ms las manos, la cara o los tobillos. Cambios en la salud Es posible que tenga acidez estomacal. Es posible que tenga dificultades para defecar (estreimiento). Pueden aparecerle hemorroides. Estas son venas hinchadas en el ano que pueden picar o doler. Puede comenzar a tener venas hinchadas (vrices) en las piernas. Puede presentar ms dolor en la pelvis, la espalda o los muslos. Puede presentar ms hormigueo o entumecimiento en las manos, los brazos y las piernas. La piel de su vientre tambin puede sentirse entumecida. Es posible que sienta falta de aire a medida que el tero se agranda. Otros cambios Es posible que haga pis (orine) con mayor frecuencia. Puede tener ms  problemas para dormir. Puede notar que el beb en gestacin "baja" o se mueve ms hacia bajo, en el vientre. Puede notar ms secrecin proveniente de la vagina. Puede sentir las articulaciones flojas y puede sentir dolor alrededor del hueso plvico. Siga estas instrucciones en su casa: Medicamentos Use los medicamentos de venta libre y los recetados solamente como se lo haya indicado el mdico. Algunos medicamentos no son seguros durante el embarazo. Tome vitaminas prenatales que contengan por lo menos 600 microgramos (mcg) de cido flico. Comida y bebida Consuma comidas saludables que incluyan lo siguiente: Frutas y verduras frescas. Cereales integrales. Buenas fuentes de protenas, como carne, huevos y tofu. Productos lcteos con bajo contenido de grasa. Evite la carne cruda y el jugo, la leche y el queso sin pasteurizar. Estos portan grmenes que pueden provocar dao tanto a usted como al beb. Tome 4 o 5 comidas pequeas en lugar de 3 comidas abundantes al da. Es posible que deba tomar medidas para prevenir o tratar los problemas para defecar: Beber suficiente lquido para mantener el pis (orina) de color amarillo plido. Come alimentos ricos en fibra. Entre ellos, frijoles, cereales integrales y frutas y verduras frescas. Limitar los alimentos con alto contenido de grasa y azcar. Estos incluyen alimentos fritos o dulces. Actividad Haga ejercicios solamente como se lo haya indicado el mdico. Interrumpa la actividad fsica si comienza a tener clicos en el tero. Evite levantar pesos excesivos. No haga ejercicio si hace demasiado calor, hay demasiada humedad o se encuentra en un lugar de mucha   altura (altitud elevada). Si lo desea, puede continuar teniendo relaciones sexuales, a menos que el mdico le indique lo contrario. Alivio del dolor y del malestar Haga pausas con frecuencia y descanse con las piernas levantadas (elevadas) si tiene calambres en las piernas o dolor en la parte  baja de la espalda. Dese baos de asiento con agua tibia para aliviar el dolor o las molestias causadas por las hemorroides. Use una crema para las hemorroides si el mdico la autoriza. Use un sostn que le brinde buen soporte si sus mamas estn sensibles. Si desarrolla venas hinchadas y abultadas en las piernas: Use medias de compresin segn las indicaciones de su mdico. Levante los pies durante 15 minutos, 3 o 4 veces por da. Limite la sal en sus alimentos. Seguridad Hable con el mdico antes de recorrer largas distancias. No se d baos de inmersin en agua caliente, baos turcos ni saunas. Use el cinturn de seguridad en todo momento mientras vaya en auto. Hable con el mdico si alguien le est haciendo dao o gritando mucho. Preparacin para la llegada del beb Para prepararse para la llegada de su beb: Tome clases prenatales. Visite el hospital y recorra el rea de maternidad. Compre un asiento de seguridad orientado hacia atrs para llevar al beb en el automvil. Aprenda cmo instalarlo en el auto. Prepare la habitacin del beb. Saque todas las almohadas y los animales de peluche de la cuna del beb. Instrucciones generales Evite el contacto con las bandejas sanitarias de los gatos y la tierra que estos animales usan. Estos contienen grmenes que pueden daar al beb y causar la prdida del beb ya sea aborto espontneo o muerte fetal. No se haga duchas vaginales ni use tampones. No use tampones ni toallas higinicas perfumadas. No fume ni consuma ningn producto que contenga nicotina o tabaco. Si necesita ayuda para dejar de fumar, consulte al mdico. No beba alcohol. No use medicamentos a base de hierbas, drogas ilegales, ni medicamentos que el mdico no haya autorizado. Las sustancias qumicas de estos productos pueden afectar al beb. Cumpla con todas las visitas de seguimiento. Esto es importante. Dnde buscar ms informacin American Pregnancy Association (Asociacin  Americana del Embarazo): americanpregnancy.org American College of Obstetricians and Gynecologists (Colegio Estadounidense de Obstetras y Gineclogos): www.acog.org Office on Women's Health (Oficina para la Salud de la Mujer): womenshealth.gov/pregnancy Comunquese con un mdico si: Tiene fiebre. Tiene clicos leves o siente presin en la parte baja del vientre. Sufre un dolor persistente en el abdomen. Vomita o hace deposiciones acuosas (diarrea). Advierte lquido con mal olor que proviene de la vagina. Siente dolor al orinar o hace orina con mal olor. Tiene un dolor de cabeza que no desaparece despus de tomar analgsicos. Nota cambios en la visin o ve manchas delante de los ojos. Solicite ayuda de inmediato si: Rompe la bolsa. Tiene contracciones regulares separadas por menos de 5 minutos. Tiene sangrado o pequeas prdidas vaginales. Tiene clicos o dolor muy intensos en el vientre. Tiene dificultad para respirar. Sientes dolor en el pecho. Se desmaya. No ha sentido al beb moverse durante el tiempo que le indic el mdico. Tiene dolor, hinchazn o enrojecimiento nuevos en un brazo o una pierna o se produce un aumento de alguno de estos sntomas. Resumen El tercer trimestre comprende desde la semana 28 hasta la semana 40 (desde el mes 7 hasta el mes 9). Esta es la poca en que el beb en gestacin crece muy rpidamente. Durante este perodo, las molestias pueden aumentar a medida que usted   sube de peso y el beb crece. Preprese para la llegada del beb: asista a las clases prenatales, compre un asiento de seguridad orientado hacia atrs para llevar al beb en auto y prepare la habitacin del beb. Solicite ayuda de inmediato si tiene sangrado por la vagina, siente dolor en el pecho y tiene dificultad para respirar, o si no ha sentido al beb moverse durante el tiempo que le indic el mdico. Esta informacin no tiene Theme park manager el consejo del mdico. Asegrese de hacerle al  mdico cualquier pregunta que tenga. Document Revised: 06/16/2020 Document Reviewed: 06/16/2020 Elsevier Patient Education  2023 Elsevier Inc. Parto vaginal Vaginal Delivery  Parto vaginal significa que usted da a luz empujando al beb fuera del canal del parto (vagina). Su equipo de atencin mdica la ayudar antes, durante y despus del parto vaginal. Las experiencias de los nacimientos son nicas para todas las [place], y Sports administrator y las experiencias de nacimiento varan segn dnde elija dar a luz. Cules son los riesgos y beneficios? En general, esto es seguro. Sin embargo, pueden ocurrir complicaciones, por ejemplo: Sangrado. Infeccin. Dao a otras estructuras, como desgarro vaginal. Reacciones alrgicas a los medicamentos. A pesar de los riesgos, los beneficios del parto vaginal incluyen un menor riesgo de sangrado e infeccin y un menor tiempo de recuperacin en comparacin con el parto por cesrea. El parto por cesrea es el parto del beb por medios quirrgicos. Ladell Heads ocurrir cuando llegue al centro de [place] o al hospital? Pollyann Savoy que se inicie el [place] de parto y haya sido admitida en el hospital o centro de [place], su equipo de atencin mdica podr hacer lo siguiente: Revisar sus antecedentes de Psychiatrist y cualquier inquietud que usted pueda Warehouse manager. Hablar con usted sobre su plan de nacimiento y Chiropractor las opciones para Human resources officer. Controlarle la presin arterial, la respiracin y los latidos cardacos. Evaluar los latidos cardacos del beb. Monitorear el tero para Tree surgeon. Verificar si la bolsa de agua (saco amnitico) se ha roto (ruptura). Colocarle una va intravenosa en una de las venas. Esto se podr usar para administrarle lquidos y medicamentos. Monitoreo Su equipo de atencin mdica puede Development worker, community las contracciones (monitoreo uterino) y la frecuencia cardaca del beb (monitoreo fetal). Es posible que el monitoreo se necesite  realizar: Con frecuencia, pero no continuamente (intermitentemente). Todo el tiempo o durante largos perodos a la vez (continuamente). El monitoreo continuo puede ser necesario si: Est recibiendo determinados medicamentos, tales como medicamentos para Engineer, materials o para hacer que las contracciones sean ms fuertes. Tiene complicaciones durante el embarazo o el [place]. El monitoreo se puede realizar: Al colocar un estetoscopio especial o un dispositivo manual de monitoreo en el abdomen o verificar los latidos cardacos del beb y comprobar las contracciones. Al colocar monitores en el abdomen (monitores externos) para Passenger transport manager los latidos cardacos del beb y la frecuencia y duracin de las contracciones. Al colocar monitores dentro del tero a travs de la vagina (monitores internos) para Passenger transport manager los latidos cardacos del beb y la frecuencia, duracin y fuerza de sus contracciones. Segn el tipo de monitor, Insurance claims handler en el tero o en la cabeza del beb hasta el nacimiento. Telemetra. Se trata de un tipo de monitoreo continuo que se puede Education officer, environmental con monitores externos o internos. En lugar de Hospital doctor en la cama, usted puede desplazarse. Examen fsico Su equipo de atencin mdica puede Medical illustrator fsicos frecuentes. Esto puede incluir: Investment banker, operational cmo y dnde el beb  est ubicado en el tero. Verificar el cuello uterino para determinar: Si se est afinando o estirando (borrando). Si se est abriendo (dilatando). Qu sucede durante el [employer] de parto y [place]?  [place] de parto y el parto normales se dividen en tres etapas: Etapa 1 Esta es la etapa ms larga del trabajo de Beatrice. Durante esta etapa, sentir contracciones. En general, las contracciones son leves, infrecuentes e irregulares al principio. Se hacen ms fuertes, ms frecuentes y ms regulares a medida que avanza en esta etapa. Puede tener contracciones cada 2 o 3 minutos. Esta  etapa finaliza cuando el cuello uterino est completamente dilatado hasta 4 pulgadas (10 cm) y completamente borrado. Etapa 2 Esta etapa comienza una vez que el cuello uterino est totalmente borrado y dilatado, y dura hasta el nacimiento del beb. Esta es la etapa en la que va a sentir ganas de pujar al beb fuera de la vagina. Puede sentir un dolor urente y por estiramiento, especialmente cuando la parte ms ancha de la cabeza del beb pasa a travs de la abertura vaginal (coronacin). Una vez que el beb nace, el cordn umbilical se pinzar y se cortar. El momento de cortar el cordn depender de sus deseos, de la salud del beb y de los mtodos del mdico. Clinical biochemist al beb sobre su pecho desnudo (contacto piel con piel) en una posicin erguida y [place] con [place] abrigada. Si optar por amamantar, observe al beb para detectar seales de hambre, como el reflejo de bsqueda o succin, y acrquelo al pecho para su primera alimentacin. Etapa 3 Esta etapa comienza inmediatamente despus del nacimiento del beb y finaliza despus de la expulsin de la placenta. Esta etapa puede durar de 5 a 30 minutos. Despus del nacimiento del beb, puede sentir contracciones cuando el cuerpo expulsa la placenta. Estas contracciones tambin ayudan a que el tero reduzca su tamao y a Warehouse manager. Qu puedo esperar despus del [place]en [place] de parto y [place]? Una vez que termina el [place], se los evaluar a usted y al beb atentamente hasta que estn listos para ir a casa. Su equipo de atencin Art gallery manager cmo cuidarse y cuidar a su beb. Le recomendarn que usted y el beb permanezcan en la misma sala (cohabitacin) durante su estada en el hospital. Esto ayudar a fomentar un vnculo temprano y Administrator, Civil Service. Se le controlar y Engineer, maintenance (IT) el tero con regularidad (masaje fndico). Puede seguir recibiendo lquidos o medicamentos por va intravenosa. Tendr algo de inflamacin  y dolor en el abdomen, la vagina y la zona de la piel entre la abertura vaginal y el ano (perineo). Si se le realiz una incisin cerca de la vagina (episiotoma) o si ha tenido Systems developer vaginal durante el parto, podran indicarle que se coloque compresas fras sobre la episiotoma o Art therapist. Esto ayuda a Engineer, materials y la hinchazn. Es normal tener hemorragia vaginal despus del [place]. Use un apsito sanitario para el sangrado vaginal y secrecin. Resumen Parto vaginal significa que usted dar a luz empujando al beb fuera del canal del parto (vagina). Su equipo de atencin mdica lo controlar a usted y al beb durante las etapas del [place]. Despus del parto, su equipo de atencin mdica continuar evalundolos a usted y al beb para asegurarse de que ambos se estn recuperando segn lo esperado despus del parto. Esta informacin no tiene Theme park manager el consejo del mdico. Asegrese de hacerle al mdico cualquier pregunta que tenga.  Document Revised: 11/09/2020 Document Reviewed: 11/09/2020 Elsevier Patient Education  2023 [employer]

## 2022-05-04 NOTE — Progress Notes (Signed)
Subjective:  Kiara Webster is a 31 y.o. G1P0 at [redacted]w[redacted]d being seen today for ongoing prenatal care.  She is currently monitored for the following issues for this high-risk pregnancy and has Supervision of high risk pregnancy, antepartum; Marginal insertion of umbilical cord affecting management of mother; and Gestational diabetes mellitus (GDM) controlled on oral hypoglycemic drug on their problem list.  Patient reports general discomforts of pregnancy.  Contractions: Irritability. Vag. Bleeding: None.  Movement: Present. Denies leaking of fluid.   The following portions of the patient's history were reviewed and updated as appropriate: allergies, current medications, past family history, past medical history, past social history, past surgical history and problem list. Problem list updated.  Objective:   Vitals:   05/04/22 1539  BP: 110/74  Pulse: (!) 105  Weight: 230 lb (104.3 kg)    Fetal Status: Fetal Heart Rate (bpm): 146   Movement: Present     General:  Alert, oriented and cooperative. Patient is in no acute distress.  Skin: Skin is warm and dry. No rash noted.   Cardiovascular: Normal heart rate noted  Respiratory: Normal respiratory effort, no problems with respiration noted  Abdomen: Soft, gravid, appropriate for gestational age. Pain/Pressure: Present     Pelvic:  Cervical exam performed        Extremities: Normal range of motion.  Edema: Trace  Mental Status: Normal mood and affect. Normal behavior. Normal judgment and thought content.   Urinalysis:      Assessment and Plan:  Pregnancy: G1P0 at [redacted]w[redacted]d  1. Supervision of high risk pregnancy, antepartum Labor precautions GBS and vaginal cultures today  2. Marginal insertion of umbilical cord affecting management of mother Stable Serial growth scans as per MFM  3. Gestational diabetes mellitus (GDM) in third trimester controlled on oral hypoglycemic drug CBG's in goal range Serial growth scan and antenatal testing as per  MFM Schedule IOL at next visit   Preterm labor symptoms and general obstetric precautions including but not limited to vaginal bleeding, contractions, leaking of fluid and fetal movement were reviewed in detail with the patient. Please refer to After Visit Summary for other counseling recommendations.  No follow-ups on file.   Chancy Milroy, MD

## 2022-05-08 LAB — CERVICOVAGINAL ANCILLARY ONLY
Chlamydia: NEGATIVE
Comment: NEGATIVE
Comment: NEGATIVE
Comment: NORMAL
Neisseria Gonorrhea: NEGATIVE
Trichomonas: NEGATIVE

## 2022-05-08 LAB — CULTURE, BETA STREP (GROUP B ONLY): Strep Gp B Culture: NEGATIVE

## 2022-05-09 ENCOUNTER — Ambulatory Visit: Payer: Medicaid Other | Admitting: *Deleted

## 2022-05-09 ENCOUNTER — Ambulatory Visit: Payer: Medicaid Other | Attending: Obstetrics

## 2022-05-09 VITALS — BP 119/59 | HR 90

## 2022-05-09 DIAGNOSIS — Z3A36 36 weeks gestation of pregnancy: Secondary | ICD-10-CM

## 2022-05-09 DIAGNOSIS — O0933 Supervision of pregnancy with insufficient antenatal care, third trimester: Secondary | ICD-10-CM | POA: Diagnosis not present

## 2022-05-09 DIAGNOSIS — O0990 Supervision of high risk pregnancy, unspecified, unspecified trimester: Secondary | ICD-10-CM | POA: Diagnosis present

## 2022-05-09 DIAGNOSIS — O99213 Obesity complicating pregnancy, third trimester: Secondary | ICD-10-CM

## 2022-05-09 DIAGNOSIS — O43193 Other malformation of placenta, third trimester: Secondary | ICD-10-CM | POA: Diagnosis not present

## 2022-05-09 DIAGNOSIS — Z6841 Body Mass Index (BMI) 40.0 and over, adult: Secondary | ICD-10-CM | POA: Diagnosis present

## 2022-05-09 DIAGNOSIS — O24415 Gestational diabetes mellitus in pregnancy, controlled by oral hypoglycemic drugs: Secondary | ICD-10-CM | POA: Insufficient documentation

## 2022-05-09 DIAGNOSIS — O43199 Other malformation of placenta, unspecified trimester: Secondary | ICD-10-CM

## 2022-05-09 DIAGNOSIS — E669 Obesity, unspecified: Secondary | ICD-10-CM

## 2022-05-09 DIAGNOSIS — Z148 Genetic carrier of other disease: Secondary | ICD-10-CM

## 2022-05-16 ENCOUNTER — Ambulatory Visit: Payer: Medicaid Other | Attending: Obstetrics

## 2022-05-16 ENCOUNTER — Ambulatory Visit (INDEPENDENT_AMBULATORY_CARE_PROVIDER_SITE_OTHER): Payer: Medicaid Other | Admitting: Obstetrics & Gynecology

## 2022-05-16 ENCOUNTER — Ambulatory Visit: Payer: Medicaid Other | Admitting: *Deleted

## 2022-05-16 VITALS — BP 114/58 | HR 81

## 2022-05-16 VITALS — BP 121/83 | HR 92 | Wt 233.8 lb

## 2022-05-16 DIAGNOSIS — O24419 Gestational diabetes mellitus in pregnancy, unspecified control: Secondary | ICD-10-CM | POA: Insufficient documentation

## 2022-05-16 DIAGNOSIS — E669 Obesity, unspecified: Secondary | ICD-10-CM

## 2022-05-16 DIAGNOSIS — O285 Abnormal chromosomal and genetic finding on antenatal screening of mother: Secondary | ICD-10-CM | POA: Diagnosis not present

## 2022-05-16 DIAGNOSIS — O24415 Gestational diabetes mellitus in pregnancy, controlled by oral hypoglycemic drugs: Secondary | ICD-10-CM

## 2022-05-16 DIAGNOSIS — Z3A37 37 weeks gestation of pregnancy: Secondary | ICD-10-CM

## 2022-05-16 DIAGNOSIS — O0990 Supervision of high risk pregnancy, unspecified, unspecified trimester: Secondary | ICD-10-CM | POA: Diagnosis present

## 2022-05-16 DIAGNOSIS — O0933 Supervision of pregnancy with insufficient antenatal care, third trimester: Secondary | ICD-10-CM | POA: Diagnosis not present

## 2022-05-16 DIAGNOSIS — O43199 Other malformation of placenta, unspecified trimester: Secondary | ICD-10-CM | POA: Diagnosis present

## 2022-05-16 DIAGNOSIS — O43193 Other malformation of placenta, third trimester: Secondary | ICD-10-CM | POA: Insufficient documentation

## 2022-05-16 DIAGNOSIS — Z148 Genetic carrier of other disease: Secondary | ICD-10-CM

## 2022-05-16 DIAGNOSIS — O99213 Obesity complicating pregnancy, third trimester: Secondary | ICD-10-CM | POA: Insufficient documentation

## 2022-05-16 NOTE — Progress Notes (Signed)
    PRENATAL VISIT NOTE  Subjective:  Kiara Webster is a 31 y.o. G1P0 at [redacted]w[redacted]d being seen today for ongoing prenatal care.  She is currently monitored for the following issues for this high-risk pregnancy and has Supervision of high risk pregnancy, antepartum; Marginal insertion of umbilical cord affecting management of mother; and Gestational diabetes mellitus (GDM) controlled on oral hypoglycemic drug on their problem list.  Patient reports no complaints.  Contractions: Irritability. Vag. Bleeding: None.  Movement: Present. Denies leaking of fluid.   The following portions of the patient's history were reviewed and updated as appropriate: allergies, current medications, past family history, past medical history, past social history, past surgical history and problem list.   Objective:   Vitals:   05/16/22 1349  BP: 121/83  Pulse: 92  Weight: 233 lb 12.8 oz (106.1 kg)    Fetal Status: Fetal Heart Rate (bpm): 143   Movement: Present     General:  Alert, oriented and cooperative. Patient is in no acute distress.  Skin: Skin is warm and dry. No rash noted.   Cardiovascular: Normal heart rate noted  Respiratory: Normal respiratory effort, no problems with respiration noted  Abdomen: Soft, gravid, appropriate for gestational age.  Pain/Pressure: Present     Pelvic: Cervical exam deferred        Extremities: Normal range of motion.  Edema: Trace  Mental Status: Normal mood and affect. Normal behavior. Normal judgment and thought content.   Assessment and Plan:  Pregnancy: G1P0 at [redacted]w[redacted]d 1. Gestational diabetes mellitus (GDM) in third trimester controlled on oral hypoglycemic drug FBS in range and all PP <120 2. Marginal insertion of umbilical cord affecting management of mother Normal growth  3. Supervision of high risk pregnancy, antepartum   Preterm labor symptoms and general obstetric precautions including but not limited to vaginal bleeding, contractions, leaking of fluid and  fetal movement were reviewed in detail with the patient. Please refer to After Visit Summary for other counseling recommendations.   Return in about 1 week (around 05/23/2022).  Future Appointments  Date Time Provider [place]  05/16/2022  3:30 PM [facility]URSE [facility]  05/16/2022  3:45 PM [facility] US5 [facility]  05/24/2022  2:15 PM [facility] NST [facility]    Emeterio Reeve, MD

## 2022-05-16 NOTE — Progress Notes (Signed)
Pt reports fetal movement with occasional cramping and pressure. Pt reports fasting BG today was 90.

## 2022-05-17 ENCOUNTER — Encounter (HOSPITAL_COMMUNITY): Payer: Self-pay | Admitting: *Deleted

## 2022-05-17 ENCOUNTER — Telehealth (HOSPITAL_COMMUNITY): Payer: Self-pay | Admitting: *Deleted

## 2022-05-17 ENCOUNTER — Other Ambulatory Visit: Payer: Self-pay | Admitting: Advanced Practice Midwife

## 2022-05-17 NOTE — Telephone Encounter (Signed)
Preadmission screen  Interpreter number [phone]

## 2022-05-19 ENCOUNTER — Other Ambulatory Visit: Payer: Self-pay | Admitting: Advanced Practice Midwife

## 2022-05-19 DIAGNOSIS — O24419 Gestational diabetes mellitus in pregnancy, unspecified control: Secondary | ICD-10-CM

## 2022-05-24 ENCOUNTER — Ambulatory Visit: Payer: Medicaid Other | Admitting: *Deleted

## 2022-05-24 ENCOUNTER — Ambulatory Visit (INDEPENDENT_AMBULATORY_CARE_PROVIDER_SITE_OTHER): Payer: Medicaid Other

## 2022-05-24 VITALS — BP 115/70 | HR 88

## 2022-05-24 DIAGNOSIS — O24415 Gestational diabetes mellitus in pregnancy, controlled by oral hypoglycemic drugs: Secondary | ICD-10-CM

## 2022-05-24 NOTE — Progress Notes (Signed)
Pt informed that the ultrasound is considered a limited OB ultrasound and is not intended to be a complete ultrasound exam.  Patient also informed that the ultrasound is not being completed with the intent of assessing for fetal or placental anomalies or any pelvic abnormalities.  Explained that the purpose of today's ultrasound is to assess for presentation, BPP and amniotic fluid volume.  Patient acknowledges the purpose of the exam and the limitations of the study.    IOL scheduled on 6/10

## 2022-05-25 ENCOUNTER — Encounter: Payer: Self-pay | Admitting: Obstetrics

## 2022-05-25 ENCOUNTER — Ambulatory Visit (INDEPENDENT_AMBULATORY_CARE_PROVIDER_SITE_OTHER): Payer: Medicaid Other | Admitting: Obstetrics

## 2022-05-25 VITALS — BP 123/78 | HR 98 | Wt 236.7 lb

## 2022-05-25 DIAGNOSIS — O0990 Supervision of high risk pregnancy, unspecified, unspecified trimester: Secondary | ICD-10-CM

## 2022-05-25 DIAGNOSIS — O24419 Gestational diabetes mellitus in pregnancy, unspecified control: Secondary | ICD-10-CM

## 2022-05-25 DIAGNOSIS — O99210 Obesity complicating pregnancy, unspecified trimester: Secondary | ICD-10-CM

## 2022-05-25 NOTE — Progress Notes (Signed)
Pt reports fetal movement and pressure. Reports that fasting BG's have been in the 90s

## 2022-05-25 NOTE — Progress Notes (Signed)
Subjective:  Kiara Webster is a 31 y.o. G1P0 at [redacted]w[redacted]d being seen today for ongoing prenatal care.  She is currently monitored for the following issues for this high-risk pregnancy and has Supervision of high risk pregnancy, antepartum; Marginal insertion of umbilical cord affecting management of mother; and Gestational diabetes mellitus (GDM) controlled on oral hypoglycemic drug on their problem list.  Patient reports no complaints.  Contractions: Not present. Vag. Bleeding: None.  Movement: Present. Denies leaking of fluid.   The following portions of the patient's history were reviewed and updated as appropriate: allergies, current medications, past family history, past medical history, past social history, past surgical history and problem list. Problem list updated.  Objective:   Vitals:   05/25/22 1633  BP: 123/78  Pulse: 98  Weight: 236 lb 11.2 oz (107.4 kg)    Fetal Status: Fetal Heart Rate (bpm): 142   Movement: Present     General:  Alert, oriented and cooperative. Patient is in no acute distress.  Skin: Skin is warm and dry. No rash noted.   Cardiovascular: Normal heart rate noted  Respiratory: Normal respiratory effort, no problems with respiration noted  Abdomen: Soft, gravid, appropriate for gestational age. Pain/Pressure: Present     Pelvic:  Cervical exam deferred        Extremities: Normal range of motion.  Edema: Trace  Mental Status: Normal mood and affect. Normal behavior. Normal judgment and thought content.   Urinalysis:      Assessment and Plan:  Pregnancy: G1P0 at [redacted]w[redacted]d  1. Supervision of high risk pregnancy, antepartum  2. GDM, class A2 - good glucose control:  FBS's = low 90's  3. Obesity affecting pregnancy, antepartum   Term labor symptoms and general obstetric precautions including but not limited to vaginal bleeding, contractions, leaking of fluid and fetal movement were reviewed in detail with the patient. Please refer to After Visit Summary for  other counseling recommendations.   Return in about 4 weeks (around 06/22/2022) for postpartum visit.   Shelly Bombard, MD  05/25/22

## 2022-05-27 ENCOUNTER — Inpatient Hospital Stay (HOSPITAL_COMMUNITY)
Admission: AD | Admit: 2022-05-27 | Discharge: 2022-05-30 | DRG: 805 | Disposition: A | Discharge status: Home / Self Care | Payer: Medicaid Other | Attending: Obstetrics and Gynecology | Admitting: Obstetrics and Gynecology

## 2022-05-27 ENCOUNTER — Inpatient Hospital Stay (HOSPITAL_COMMUNITY): Payer: Medicaid Other

## 2022-05-27 ENCOUNTER — Encounter (HOSPITAL_COMMUNITY): Payer: Self-pay | Admitting: Obstetrics and Gynecology

## 2022-05-27 DIAGNOSIS — O99214 Obesity complicating childbirth: Secondary | ICD-10-CM | POA: Diagnosis present

## 2022-05-27 DIAGNOSIS — Z3A39 39 weeks gestation of pregnancy: Secondary | ICD-10-CM | POA: Diagnosis not present

## 2022-05-27 DIAGNOSIS — O24419 Gestational diabetes mellitus in pregnancy, unspecified control: Secondary | ICD-10-CM | POA: Insufficient documentation

## 2022-05-27 DIAGNOSIS — O24415 Gestational diabetes mellitus in pregnancy, controlled by oral hypoglycemic drugs: Secondary | ICD-10-CM | POA: Diagnosis present

## 2022-05-27 DIAGNOSIS — O411230 Chorioamnionitis, third trimester, not applicable or unspecified: Secondary | ICD-10-CM | POA: Diagnosis present

## 2022-05-27 DIAGNOSIS — O0990 Supervision of high risk pregnancy, unspecified, unspecified trimester: Secondary | ICD-10-CM

## 2022-05-27 DIAGNOSIS — O43123 Velamentous insertion of umbilical cord, third trimester: Secondary | ICD-10-CM | POA: Diagnosis present

## 2022-05-27 DIAGNOSIS — O43199 Other malformation of placenta, unspecified trimester: Secondary | ICD-10-CM | POA: Diagnosis present

## 2022-05-27 DIAGNOSIS — O411290 Chorioamnionitis, unspecified trimester, not applicable or unspecified: Secondary | ICD-10-CM | POA: Diagnosis not present

## 2022-05-27 DIAGNOSIS — O24425 Gestational diabetes mellitus in childbirth, controlled by oral hypoglycemic drugs: Secondary | ICD-10-CM | POA: Diagnosis present

## 2022-05-27 LAB — CBC
HCT: 30 % — ABNORMAL LOW (ref 36.0–46.0)
Hemoglobin: 9.4 g/dL — ABNORMAL LOW (ref 12.0–15.0)
MCH: 22.7 pg — ABNORMAL LOW (ref 26.0–34.0)
MCHC: 31.3 g/dL (ref 30.0–36.0)
MCV: 72.5 fL — ABNORMAL LOW (ref 80.0–100.0)
Platelets: 409 10*3/uL — ABNORMAL HIGH (ref 150–400)
RBC: 4.14 MIL/uL (ref 3.87–5.11)
RDW: 16.2 % — ABNORMAL HIGH (ref 11.5–15.5)
WBC: 10.9 10*3/uL — ABNORMAL HIGH (ref 4.0–10.5)
nRBC: 0 % (ref 0.0–0.2)

## 2022-05-27 LAB — TYPE AND SCREEN
ABO/RH(D): O POS
Antibody Screen: NEGATIVE

## 2022-05-27 LAB — GLUCOSE, CAPILLARY
Glucose-Capillary: 134 mg/dL — ABNORMAL HIGH (ref 70–99)
Glucose-Capillary: 73 mg/dL (ref 70–99)
Glucose-Capillary: 102 mg/dL — ABNORMAL HIGH (ref 70–99)
Glucose-Capillary: 97 mg/dL (ref 70–99)

## 2022-05-27 LAB — RPR: RPR Ser Ql: NONREACTIVE

## 2022-05-27 LAB — ABO/RH: ABO/RH(D): O POS

## 2022-05-27 MED ORDER — DIPHENHYDRAMINE HCL 50 MG/ML IJ SOLN: 12.5000 mg | INTRAMUSCULAR | Status: DC | PRN

## 2022-05-27 MED ORDER — EPHEDRINE 5 MG/ML INJ: 10.0000 mg | INTRAVENOUS | Status: DC | PRN

## 2022-05-27 MED ORDER — OXYCODONE-ACETAMINOPHEN 5-325 MG PO TABS: 1.0000 | ORAL_TABLET | ORAL | Status: DC | PRN

## 2022-05-27 MED ORDER — PHENYLEPHRINE 80 MCG/ML (10ML) SYRINGE FOR IV PUSH (FOR BLOOD PRESSURE SUPPORT)
80.0000 ug | PREFILLED_SYRINGE | INTRAVENOUS | Status: DC | PRN
Start: 2022-05-27 — End: 2022-05-28
  Filled 2022-05-27: qty 10

## 2022-05-27 MED ORDER — MISOPROSTOL 50MCG HALF TABLET
50.0000 ug | ORAL_TABLET | ORAL | Status: DC
Start: 2022-05-27 — End: 2022-05-28
  Administered 2022-05-27: 50 ug via ORAL
  Filled 2022-05-27: qty 1

## 2022-05-27 MED ORDER — LACTATED RINGERS IV SOLN
500.0000 mL | Freq: Once | INTRAVENOUS | Status: AC
  Administered 2022-05-28: 500 mL via INTRAVENOUS

## 2022-05-27 MED ORDER — MISOPROSTOL 25 MCG QUARTER TABLET
25.0000 ug | ORAL_TABLET | ORAL | Status: DC | PRN
  Administered 2022-05-27: 25 ug via VAGINAL
  Filled 2022-05-27: qty 1

## 2022-05-27 MED ORDER — OXYTOCIN BOLUS FROM INFUSION
333.0000 mL | Freq: Once | INTRAVENOUS | Status: AC
  Administered 2022-05-28: 333 mL via INTRAVENOUS

## 2022-05-27 MED ORDER — OXYTOCIN-SODIUM CHLORIDE 30-0.9 UT/500ML-% IV SOLN: 2.5000 [IU]/h | INTRAVENOUS | Status: DC

## 2022-05-27 MED ORDER — SOD CITRATE-CITRIC ACID 500-334 MG/5ML PO SOLN: 30.0000 mL | ORAL | Status: DC | PRN

## 2022-05-27 MED ORDER — PHENYLEPHRINE 80 MCG/ML (10ML) SYRINGE FOR IV PUSH (FOR BLOOD PRESSURE SUPPORT)
80.0000 ug | PREFILLED_SYRINGE | INTRAVENOUS | Status: AC | PRN
  Administered 2022-05-28 (×3): 80 ug via INTRAVENOUS

## 2022-05-27 MED ORDER — ACETAMINOPHEN 325 MG PO TABS
650.0000 mg | ORAL_TABLET | ORAL | Status: DC | PRN
  Administered 2022-05-27 – 2022-05-28 (×4): 650 mg via ORAL
  Filled 2022-05-27 (×4): qty 2

## 2022-05-27 MED ORDER — OXYCODONE-ACETAMINOPHEN 5-325 MG PO TABS: 2.0000 | ORAL_TABLET | ORAL | Status: DC | PRN

## 2022-05-27 MED ORDER — LIDOCAINE HCL (PF) 1 % IJ SOLN: 30.0000 mL | INTRAMUSCULAR | Status: DC | PRN

## 2022-05-27 MED ORDER — LACTATED RINGERS IV SOLN: INTRAVENOUS | Status: DC

## 2022-05-27 MED ORDER — FENTANYL CITRATE (PF) 100 MCG/2ML IJ SOLN: 100.0000 ug | INTRAMUSCULAR | Status: DC | PRN

## 2022-05-27 MED ORDER — LACTATED RINGERS IV SOLN
500.0000 mL | INTRAVENOUS | Status: DC | PRN
  Administered 2022-05-28: 500 mL via INTRAVENOUS

## 2022-05-27 MED ORDER — FENTANYL-BUPIVACAINE-NACL 0.5-0.125-0.9 MG/250ML-% EP SOLN
12.0000 mL/h | EPIDURAL | Status: DC | PRN
  Administered 2022-05-28: 12 mL/h via EPIDURAL
  Filled 2022-05-27: qty 250

## 2022-05-27 MED ORDER — TERBUTALINE SULFATE 1 MG/ML IJ SOLN: 0.2500 mg | Freq: Once | INTRAMUSCULAR | Status: DC | PRN

## 2022-05-27 MED ORDER — ONDANSETRON HCL 4 MG/2ML IJ SOLN
4.0000 mg | Freq: Four times a day (QID) | INTRAMUSCULAR | Status: DC | PRN
  Administered 2022-05-28: 4 mg via INTRAVENOUS
  Filled 2022-05-27: qty 2

## 2022-05-27 NOTE — Progress Notes (Signed)
Labor Progress Note Kiara Webster is a 31 y.o. G1P0 at [redacted]w[redacted]d who presented for IOL due to A2GDM.   S: Doing well. Feeling her contractions more but still reports them as mild. No concerns. Partner at bedside.   O:  BP 128/63   Pulse 95   Temp 98.1 F (36.7 C) (Oral)   Resp 17   LMP 08/27/2021   EFM: Baseline 145 bpm, moderate variability, + accels, no decels   CVE: Dilation: 3.5 Effacement (%): 50 Cervical Position: Posterior Station: -3 Presentation: Vertex Exam by:: Dr. Mathis Fare  A&P: 31 y.o. G1P0 [redacted]w[redacted]d   #Labor: Progressing well. Small amount of clear fluid noted at introitus. Bulging bag noted on exam, suspect possible high leak. After verbal consent, AROM performed with moderate amount of clear fluid. Mom and baby tolerated this well. Will monitor contraction pattern over the next 30 minutes. Plan to augment with Pitocin as needed. Contracting well every 2-3 minutes currently. Will reassess in 4-5 hours. #Pain: PRN; coping well  #FWB: Cat 1  #GBS negative  #A2GDM: CBGs remain within normal limits. Will continue to check every 4 hours in latent phase.   In-person Spanish interpreter used for encounter.   Worthy Rancher, MD 7:23 PM

## 2022-05-27 NOTE — Progress Notes (Signed)
Labor Progress Note Kiara Webster is a 31 y.o. G1P0 at [redacted]w[redacted]d who presented for IOL due to A2GDM.  S: Doing well. Walking around unit. No concerns per RN.   O:  BP (!) 108/58   Pulse 92   Temp 98 F (36.7 C) (Axillary)   Resp 17   LMP 08/27/2021   EFM: Baseline 150 bpm, moderate variability, + accels, no decels  Toco: Every 2-5 minutes   CVE: Dilation: 2 Effacement (%): 60 Cervical Position: Posterior Station: -3 Presentation: Vertex Exam by:: Carlyn Reichert RN  A&P: 31 y.o. G1P0 [redacted]w[redacted]d   #Labor: Progressing well. Additional dose of Cytotec given at 1349 (buccal). Will reassess in 4 hours.  #Pain: PRN; coping well #FWB: Cat 1  #GBS negative  #A2GDM: Last CBG 73, within normal range. Will continue to monitor every 4 hours in latent phase.   Worthy Rancher, MD 2:47 PM

## 2022-05-27 NOTE — H&P (Signed)
OBSTETRIC ADMISSION HISTORY AND PHYSICAL  Kiara Webster is a 31 y.o. female G1P0 with IUP at 73w0dby LMP [place] She reports +FMs, no LOF, no VB, no blurry vision, headaches, peripheral edema, or RUQ pain.  She plans on breast and bottle feeding. She is planning to use OCPs for birth control postpartum.   [facility]   Dating: By LMP --->  Estimated Date of Delivery: 06/03/22  Sono:   _0 , CWD, normal anatomy, cephalic presentation, posterior placental lie, 3254 g, 83% EFW  Prenatal History/Complications:  Marginal cord insertion GDMA2 (On Metformin)  Late to prenatal care Increased carrier risk for SMA Maternal obesity (BMI 450  Past Medical History: Past Medical History:  Diagnosis Date   Gestational diabetes    Medical history non-contributory     Past Surgical History: Past Surgical History:  Procedure Laterality Date   NO PAST SURGERIES      Obstetrical History: OB History     Gravida  1   Para      Term      Preterm      AB      Living         SAB      IAB      Ectopic      Multiple      Live Births              Social History Social History   Socioeconomic History   Marital status: Single    Spouse name: Not on file   Number of children: Not on file   Years of education: Not on file   Highest education level: Not on file  Occupational History   Not on file  Tobacco Use   Smoking status: Never   Smokeless tobacco: Never  Vaping Use   Vaping Use: Never used  Substance and Sexual Activity   Alcohol use: Not Currently   Drug use: Not Currently   Sexual activity: Yes  Other Topics Concern   Not on file  Social History Narrative   Not on file   Social Determinants of Health   Financial Resource Strain: Not on file  Food Insecurity: Not on file  Transportation Needs: Not on file  Physical Activity: Not on file  Stress: Not on file  Social  Connections: Not on file    Family History: Family History  Problem Relation Age of Onset   Cancer Neg Hx    Hypertension Neg Hx     Allergies: No Known Allergies  Medications Prior to Admission  Medication Sig Dispense Refill Last Dose   famotidine (PEPCID) 40 MG tablet Take 1 tablet (40 mg total) by mouth daily. 30 tablet 2 05/27/2022   metFORMIN (GLUCOPHAGE) 500 MG tablet Take 1 tablet (500 mg total) by mouth at bedtime. 30 tablet 2 05/26/2022   pantoprazole (PROTONIX) 40 MG tablet Take 1 tablet (40 mg total) by mouth daily. 30 tablet 5 05/27/2022   Prenat-Fe Poly-Methfol-FA-DHA (VITAFOL ULTRA) 29-0.6-0.4-200 MG CAPS Take 1 capsule by mouth daily before breakfast. 90 capsule 4 05/27/2022   Accu-Chek Softclix Lancets lancets Use one lancet to check glucose 4 times daily 100 each 12    betamethasone valerate (VALISONE) 0.1 % cream Apply topically 2 (two) times daily. 30 g 1    Blood Glucose Monitoring Suppl (ACCU-CHEK GUIDE ME) w/Device KIT Use glucose meter to check glucose 4 times daily 1 kit 0  Elastic Bandages & Supports (WRIST BRACE//LEFT LARGE) MISC 1 Device by Does not apply route daily. 1 each 0    Elastic Bandages & Supports (WRIST BRACE/RIGHT LARGE) MISC 1 Device by Does not apply route daily. (Patient not taking: Reported on 05/09/2022) 1 each 0    glucose blood (ACCU-CHEK GUIDE) test strip Use as instructed 100 each 12      Review of Systems  All systems reviewed and negative except as stated in HPI  Blood pressure (!) 108/58, pulse 92, resp. rate 16, last menstrual period 08/27/2021.  General appearance: alert, cooperative, and no distress Lungs: normal work of breathing on room air  Heart: normal rate, warm and well perfused  Abdomen: soft, non-tender, gravid  Extremities: no LE edema or calf tenderness to palpation   Presentation: Cephalic per RN Fetal monitoring: Baseline 145 bpm, moderate variability, + accels, no decels  Uterine activity: Irregular contractions   Dilation: 1.5 Effacement (%): 60 Station: -3 Exam by:: Arty Baumgartner RN  Prenatal labs: ABO, Rh: --/--/O POS (06/10 9379) Antibody: NEG (06/10 0753) Rubella: 3.61 (01/17 1304) RPR: Non Reactive (03/14 1014)  HBsAg: Negative (01/17 1304)  HIV: Non Reactive (03/14 1014)  GBS: Negative/-- (05/18 1630)  2 hr Glucola abnormal  Genetic screening - LR NIPS, AFP neg, increased carrier risk for SMA Anatomy US normal   Prenatal Transfer Tool  Maternal Diabetes: Yes:  Diabetes Type:  Insulin/Medication controlled Genetic Screening: As above  Maternal Ultrasounds/Referrals: Marginal cord insertion, otherwise normal  Fetal Ultrasounds or other Referrals:  None Maternal Substance Abuse:  No Significant Maternal Medications:  Metformin  Significant Maternal Lab Results: Group B Strep negative  Results for orders placed or performed during the hospital encounter of 05/27/22 (from the past 24 hour(s))  Type and screen   Collection Time: 05/27/22  7:53 AM  Result Value Ref Range   ABO/RH(D) O POS    Antibody Screen NEG    Sample Expiration      05/30/2022,2359 Performed at [facility] [address] [place], [place] [address]   CBC   Collection Time: 05/27/22  8:00 AM  Result Value Ref Range   WBC 10.9 (H) 4.0 - 10.5 K/uL   RBC 4.14 3.87 - 5.11 MIL/uL   Hemoglobin 9.4 (L) 12.0 - 15.0 g/dL   HCT 30.0 (L) 36.0 - 46.0 %   MCV 72.5 (L) 80.0 - 100.0 fL   MCH 22.7 (L) 26.0 - 34.0 pg   MCHC 31.3 30.0 - 36.0 g/dL   RDW 16.2 (H) 11.5 - 15.5 %   Platelets 409 (H) 150 - 400 K/uL   nRBC 0.0 0.0 - 0.2 %  Glucose, capillary   Collection Time: 05/27/22  8:00 AM  Result Value Ref Range   Glucose-Capillary 134 (H) 70 - 99 mg/dL    Patient Active Problem List   Diagnosis Date Noted   GDM, class A2 05/27/2022   Gestational diabetes mellitus (GDM) controlled on oral hypoglycemic drug 03/14/2022   Marginal insertion of umbilical cord affecting management of mother 01/31/2022    Supervision of high risk pregnancy, antepartum 01/03/2022    Assessment/Plan:  Kiara Webster is a 31 y.o. G1P0 at 54w0dhere for IOL due to A2GDM.   #Labor: Vaginal Cytotec placed at 0808. Will reassess in 4 hours. #Pain: PRN #FWB: Cat 1 #ID:  GBS neg  #MOF: Breast and bottle  #MOC: OCPs #Circ:  No   #A2GDM: EFW in the 83rd percentile at 36 weeks, AC >  99%. CBG on admission 134. Will continue to check CBGs every 4 hours and treat as indicated.   [employer] Spanish video interpreter [place], [phone]) used for entirety of encounter.   Genia Del, MD  05/27/2022, 9:32 AM

## 2022-05-27 NOTE — Progress Notes (Signed)
Patient ID: Kiara Webster, female   DOB: [birth year], 31 y.o.   MRN: [id number] Plans epidural soon, more uncomfortable  Vitals:   05/27/22 1639 05/27/22 1641 05/27/22 1956 05/27/22 1958  BP: 128/63 128/63  126/69  Pulse: 95 95  (!) 101  Resp: 17   18  Temp: 98.1 F (36.7 C)  99.2 F (37.3 C)   TempSrc: Oral  Axillary    FHR reassuring UCs regular  Last exam Dilation: 3.5 Effacement (%): 50 Cervical Position: Posterior Station: -3 Presentation: Vertex Exam by:: Dr. Mathis Fare

## 2022-05-28 ENCOUNTER — Inpatient Hospital Stay (HOSPITAL_COMMUNITY): Payer: Medicaid Other | Admitting: Anesthesiology

## 2022-05-28 ENCOUNTER — Encounter (HOSPITAL_COMMUNITY): Payer: Self-pay | Admitting: Obstetrics and Gynecology

## 2022-05-28 DIAGNOSIS — Z3A39 39 weeks gestation of pregnancy: Secondary | ICD-10-CM | POA: Diagnosis not present

## 2022-05-28 DIAGNOSIS — O24425 Gestational diabetes mellitus in childbirth, controlled by oral hypoglycemic drugs: Secondary | ICD-10-CM | POA: Diagnosis not present

## 2022-05-28 DIAGNOSIS — O411290 Chorioamnionitis, unspecified trimester, not applicable or unspecified: Secondary | ICD-10-CM | POA: Diagnosis not present

## 2022-05-28 DIAGNOSIS — O411230 Chorioamnionitis, third trimester, not applicable or unspecified: Secondary | ICD-10-CM | POA: Diagnosis not present

## 2022-05-28 LAB — GLUCOSE, CAPILLARY
Glucose-Capillary: 87 mg/dL (ref 70–99)
Glucose-Capillary: 93 mg/dL (ref 70–99)
Glucose-Capillary: 75 mg/dL (ref 70–99)
Glucose-Capillary: 105 mg/dL — ABNORMAL HIGH (ref 70–99)
Glucose-Capillary: 76 mg/dL (ref 70–99)
Glucose-Capillary: 80 mg/dL (ref 70–99)
Glucose-Capillary: 89 mg/dL (ref 70–99)
Glucose-Capillary: 90 mg/dL (ref 70–99)

## 2022-05-28 LAB — BASIC METABOLIC PANEL
Anion gap: 8 (ref 5–15)
BUN: <5 mg/dL — ABNORMAL LOW (ref 6–20)
CO2: 20 mmol/L — ABNORMAL LOW (ref 22–32)
Calcium: 8.2 mg/dL — ABNORMAL LOW (ref 8.9–10.3)
Chloride: 109 mmol/L (ref 98–111)
Creatinine, Ser: 0.47 mg/dL (ref 0.44–1.00)
GFR, Estimated: >60 mL/min (ref >60)
Glucose, Bld: 89 mg/dL (ref 70–99)
Potassium: 3.6 mmol/L (ref 3.5–5.1)
Sodium: 137 mmol/L (ref 135–145)

## 2022-05-28 MED ORDER — LACTATED RINGERS AMNIOINFUSION: INTRAVENOUS | Status: DC

## 2022-05-28 MED ORDER — PRENATAL MULTIVITAMIN CH
1.0000 | ORAL_TABLET | Freq: Every day | ORAL | Status: DC
  Administered 2022-05-29 – 2022-05-30 (×2): 1 via ORAL
  Filled 2022-05-28 (×2): qty 1

## 2022-05-28 MED ORDER — ACETAMINOPHEN 325 MG PO TABS: 650.0000 mg | ORAL_TABLET | ORAL | Status: DC | PRN

## 2022-05-28 MED ORDER — LIDOCAINE HCL (PF) 1 % IJ SOLN
INTRAMUSCULAR | Status: DC | PRN
  Administered 2022-05-28: 11 mL via EPIDURAL

## 2022-05-28 MED ORDER — ONDANSETRON HCL 4 MG PO TABS: 4.0000 mg | ORAL_TABLET | ORAL | Status: DC | PRN

## 2022-05-28 MED ORDER — GENTAMICIN SULFATE 40 MG/ML IJ SOLN
5.0000 mg/kg | INTRAMUSCULAR | Status: DC
  Administered 2022-05-28: 540 mg via INTRAVENOUS
  Filled 2022-05-28 (×2): qty 13.5

## 2022-05-28 MED ORDER — SIMETHICONE 80 MG PO CHEW: 80.0000 mg | CHEWABLE_TABLET | ORAL | Status: DC | PRN

## 2022-05-28 MED ORDER — TERBUTALINE SULFATE 1 MG/ML IJ SOLN: 0.2500 mg | Freq: Once | INTRAMUSCULAR | Status: DC | PRN

## 2022-05-28 MED ORDER — OXYTOCIN-SODIUM CHLORIDE 30-0.9 UT/500ML-% IV SOLN
1.0000 m[IU]/min | INTRAVENOUS | Status: DC
  Administered 2022-05-28: 2 m[IU]/min via INTRAVENOUS
  Filled 2022-05-28: qty 500

## 2022-05-28 MED ORDER — SENNOSIDES-DOCUSATE SODIUM 8.6-50 MG PO TABS
2.0000 | ORAL_TABLET | Freq: Every day | ORAL | Status: DC
  Administered 2022-05-29 – 2022-05-30 (×2): 2 via ORAL
  Filled 2022-05-28 (×2): qty 2

## 2022-05-28 MED ORDER — TRANEXAMIC ACID-NACL 1000-0.7 MG/100ML-% IV SOLN
1000.0000 mg | Freq: Once | INTRAVENOUS | Status: AC | PRN
  Administered 2022-05-28: 1000 mg via INTRAVENOUS
  Filled 2022-05-28: qty 100

## 2022-05-28 MED ORDER — COCONUT OIL OIL: 1.0000 "application " | TOPICAL_OIL | Status: DC | PRN

## 2022-05-28 MED ORDER — SODIUM CHLORIDE 0.9 % IV SOLN
2.0000 g | Freq: Four times a day (QID) | INTRAVENOUS | Status: DC
  Administered 2022-05-28 (×2): 2 g via INTRAVENOUS
  Filled 2022-05-28 (×2): qty 2000

## 2022-05-28 MED ORDER — BENZOCAINE-MENTHOL 20-0.5 % EX AERO
1.0000 | INHALATION_SPRAY | CUTANEOUS | Status: DC | PRN
Start: 2022-05-28 — End: 2022-05-31

## 2022-05-28 MED ORDER — WITCH HAZEL-GLYCERIN EX PADS
1.0000 | MEDICATED_PAD | CUTANEOUS | Status: DC | PRN
Start: 2022-05-28 — End: 2022-05-31

## 2022-05-28 MED ORDER — DIPHENHYDRAMINE HCL 25 MG PO CAPS: 25.0000 mg | ORAL_CAPSULE | Freq: Four times a day (QID) | ORAL | Status: DC | PRN

## 2022-05-28 MED ORDER — ONDANSETRON HCL 4 MG/2ML IJ SOLN: 4.0000 mg | INTRAMUSCULAR | Status: DC | PRN

## 2022-05-28 MED ORDER — DIBUCAINE (PERIANAL) 1 % EX OINT: 1.0000 "application " | TOPICAL_OINTMENT | CUTANEOUS | Status: DC | PRN

## 2022-05-28 MED ORDER — IBUPROFEN 600 MG PO TABS
600.0000 mg | ORAL_TABLET | Freq: Four times a day (QID) | ORAL | Status: DC
  Administered 2022-05-28 – 2022-05-30 (×7): 600 mg via ORAL
  Filled 2022-05-28 (×7): qty 1

## 2022-05-28 NOTE — Progress Notes (Addendum)
Labor Progress Note Kiara Webster Kiara Webster is a 31 y.o. G1P0 at [redacted]w[redacted]d who presented for IOL due to A2GDM.  S: Doing well.   O:  BP (!) 110/57   Pulse 99   Temp 100.2 F (37.9 C) (Oral)   Resp 18   LMP 08/27/2021   EFM: Baseline 145 bpm, moderate variability, + accels, variable decels with contractions  Toco: Every 3-4 minutes, MVUs ~160 - 200  CVE: Dilation: 6.5 Effacement (%): 80, 90 Cervical Position: Middle Station: -2, -1 Presentation: Vertex Exam by:: Williams CNM  A&P: 31 y.o. G1P0 103w1d   #Labor: Progressing well. Pitocin currently at 16 milliUnits/min. MVUs improving but not yet consistently adequate. Will continue to up titrate Pitocin.  #Pain: Epidural  #FWB: Cat 2 due to periods of variable decelerations with contractions. Mild overall. Reassuring variability and accels. Plan to start amnioinfusion if decelerations become deeper in nature.  #GBS negative  #Triple I: Receiving Amp/Gent. Temp improving. Will continue to monitor.   #A2GDM: CBGs remain at goal. Will check every 2 hours in active phase.   Worthy Rancher, MD 1:21 PM

## 2022-05-28 NOTE — Progress Notes (Signed)
Patient ID: Kiara Webster, female   DOB: [birth year], 31 y.o.   MRN: [id number] Comfortable with epidural  Vitals:   05/28/22 0432 05/28/22 0437 05/28/22 0443 05/28/22 0501  BP: (!) 85/44 (!) 81/51 107/68 116/62  Pulse: 98 94 92 (!) 115  Resp:      Temp:      TempSrc:       FHR reassuring UCs spaced out, inadequate per IUPC  Pitocin ordered but not started yet.  Will use Pitocin per low dose protocol

## 2022-05-28 NOTE — Progress Notes (Signed)
Patient ID: Kiara Webster, female   DOB: 10-12-1991, 31 y.o.   MRN: [id number] Feeling intermittent pressure IV had to be replaced  Vitals:   05/28/22 0602 05/28/22 0632 05/28/22 0701 05/28/22 0751  BP: (!) 130/41 115/61 120/65   Pulse: (!) 106 (!) 102 (!) 106   Resp:      Temp:    99.1 F (37.3 C)  TempSrc:       FHR reassuring UCs regular  Dilation: 6.5 Effacement (%): 80, 90 Cervical Position: Middle Station: -2, -1 Presentation: Vertex Exam by:: Jimmye Norman CNM  Will continue to observe

## 2022-05-28 NOTE — Progress Notes (Signed)
Talked with patient at bedside. MVUs continue to improve. Pitocin currently at 18 milliUnits/min. Variable decelerations resolved with amnioinfusion. Cat 1 FHT. Received Tylenol again for low grade temperature.   Plan of care discussed with Dr. Vergie Living. Plan to recheck cervix in 1.5 to 2 hours from now. If unchanged, discussed recommendation for cesarean section. If progressing, may continue to proceed towards vaginal delivery as long as FHT remains reassuring. Patient voiced understanding. All questions and concerns addressed.   [facility] in-person Spanish interpreter used for encounter.   Evalina Field, MD

## 2022-05-28 NOTE — Lactation Note (Addendum)
This note was copied from a baby's chart. Lactation Consultation Note  Patient Name: Kiara Webster [id number] Date: 05/28/2022 Reason for consult: Initial assessment;Mother's request;Difficult latch;Primapara;1st time breastfeeding;Term;Breastfeeding assistance;Maternal endocrine disorder Age:31 years  [facility] Spanish interpreter present during [place] visit.  Mom feeding plan is breast and bottle (formula).  LC attempted latch but infant latching but not sucking. Mom given hand pump to pre pump before latching.   Infant fed 20 ml for formula by Dad.   Plan 1. To feed based on cues 8-12x 24hr period. Mom to offer breasts and look for signs of milk transfer.  2. Mom to supplement with EBM first then formula with pace bottle feeding and slow flow nipple. BF supplementation guide provided.    All questions answered at the end of the visit.  Mom personal electric pump in the room.   Maternal Data Has patient been taught Hand Expression?: Yes  Feeding Mother's Current Feeding Choice: Breast Milk and Formula Nipple Type: Slow - flow  LATCH Score Latch: Repeated attempts needed to sustain latch, nipple held in mouth throughout feeding, stimulation needed to elicit sucking reflex.  Audible Swallowing: None  Type of Nipple: Flat  Comfort (Breast/Nipple): Soft / non-tender  Hold (Positioning): Assistance needed to correctly position infant at breast and maintain latch.  LATCH Score: 5   Lactation Tools Discussed/Used    Interventions Interventions: Breast feeding basics reviewed;Assisted with latch;Skin to skin;Breast massage;Hand express;Breast compression;Adjust position;Support pillows;Position options;Expressed milk;Hand pump;Education;Pace feeding;Control and instrumentation engineer  Discharge Pump: Manual;Personal WIC Program: Yes  Consult Status Consult Status: Follow-up Date: 05/29/22 Follow-up type: In-patient    Jeri Jeanbaptiste   Nicholson-Springer 05/28/2022, 10:11 PM

## 2022-05-28 NOTE — Anesthesia Preprocedure Evaluation (Signed)
Anesthesia Evaluation  Patient identified by MRN, date of birth, ID band Patient awake    Reviewed: Allergy & Precautions, NPO status , Patient's Chart, lab work & pertinent test results  Airway Mallampati: II  TM Distance: >3 FB Neck ROM: Full    Dental no notable dental hx.    Pulmonary neg pulmonary ROS,    Pulmonary exam normal breath sounds clear to auscultation       Cardiovascular negative cardio ROS Normal cardiovascular exam Rhythm:Regular Rate:Normal     Neuro/Psych negative neurological ROS  negative psych ROS   GI/Hepatic negative GI ROS, Neg liver ROS,   Endo/Other  negative endocrine ROSdiabetes  Renal/GU negative Renal ROS  negative genitourinary   Musculoskeletal negative musculoskeletal ROS (+)   Abdominal (+) + obese,   Peds negative pediatric ROS (+)  Hematology negative hematology ROS (+)   Anesthesia Other Findings   Reproductive/Obstetrics (+) Pregnancy                             Anesthesia Physical Anesthesia Plan  ASA: 3  Anesthesia Plan: Epidural   Post-op Pain Management:    Induction:   PONV Risk Score and Plan:   Airway Management Planned:   Additional Equipment:   Intra-op Plan:   Post-operative Plan:   Informed Consent:   Plan Discussed with:   Anesthesia Plan Comments:         Anesthesia Quick Evaluation  

## 2022-05-28 NOTE — Plan of Care (Signed)

## 2022-05-28 NOTE — Anesthesia Procedure Notes (Signed)
Epidural Patient location during procedure: OB Start time: 05/28/2022 2:17 AM End time: 05/28/2022 2:33 AM  Staffing Anesthesiologist: Lowella Curb, MD Performed: anesthesiologist   Preanesthetic Checklist Completed: patient identified, IV checked, site marked, risks and benefits discussed, surgical consent, monitors and equipment checked, pre-op evaluation and timeout performed  Epidural Patient position: sitting Prep: ChloraPrep Patient monitoring: heart rate, cardiac monitor, continuous pulse ox and blood pressure Approach: midline Location: L2-L3 Injection technique: LOR saline  Needle:  Needle type: Tuohy  Needle gauge: 17 G Needle length: 9 cm Needle insertion depth: 6 cm Catheter type: closed end flexible Catheter size: 20 Guage Catheter at skin depth: 10 cm Test dose: negative  Assessment Events: blood not aspirated, injection not painful, no injection resistance, no paresthesia and negative IV test  Additional Notes Reason for block:procedure for pain

## 2022-05-28 NOTE — Progress Notes (Signed)
Patient ID: Kiara Webster, female   DOB: 16-Dec-1991, 31 y.o.   MRN: [id number] Getting more uncomfortable  Vitals:   05/27/22 2319 05/27/22 2358 05/28/22 0110 05/28/22 0114  BP: 137/78 136/77  (!) 135/91  Pulse: 96 97  (!) 106  Resp:      Temp: 98.9 F (37.2 C)  98.3 F (36.8 C)   TempSrc: Oral  Oral    FHR reassuring UCs regular  Dilation: 4.5 Effacement (%): 90, 100 Cervical Position: Posterior Station: -2, -1 Presentation: Vertex Exam by:: Wynelle Bourgeois, CNM  Discussed getting epidural for pain, agreeable  IUPC inserted and if contractions are hypotonic, will start Pitocin

## 2022-05-28 NOTE — Discharge Summary (Signed)
Postpartum Discharge Summary   Patient Name: Kiara Webster DOB: 1991/04/27 MRN: [id number]  Date of admission: 05/27/2022 Delivery date:05/28/2022  Delivering provider: Genia Del  Date of discharge: 05/30/2022  Admitting diagnosis: GDM, class A2 [O24.419] Intrauterine pregnancy: [redacted]w[redacted]d    Secondary diagnosis:  Principal Problem:   Vaginal delivery Active Problems:   Supervision of high risk pregnancy, antepartum   Marginal insertion of umbilical cord affecting management of mother   Gestational diabetes mellitus (GDM) controlled on oral hypoglycemic drug   Chorioamnionitis  Additional problems: n/a    Discharge diagnosis: Term Pregnancy Delivered                                              Post partum procedures:  none Augmentation: AROM, Pitocin, and Cytotec Complications: Intrauterine Inflammation or infection (Chorioamniotis)  Hospital course: Induction of Labor With Vaginal Delivery   31y.o. yo G1P0 at 30w1das admitted to the hospital 05/27/2022 for induction of labor.  Indication for induction: A2 DM.  Patient received Cytotec followed by AROM and Pitocin.  She developed chorioamnionitis while in labor, for which she was treated with Ampicillin and Gentamycin.  She had a normal vaginal delivery.  Membrane Rupture Time/Date: 6:39 PM ,05/27/2022   Delivery Method:Vaginal, Spontaneous  Episiotomy:   Lacerations:  1st degree  Details of delivery can be found in separate delivery note.  Patient had a routine postpartum course.  She fasting CBG postpartum was 85.  She will have a GTT outpatient.  She is eating, drinking, voiding, and ambulating without issue.  Her pain and bleeding are controlled.  She is breastfeeding well.  Patient is discharged home 05/30/22.  Newborn Data: Birth date:05/28/2022  Birth time:6:13 PM  Gender:Female  Living status:Living  Apgars:8 ,9  Weight:3680 g   Magnesium Sulfate received: No BMZ received: No Rhophylac:  N/A MMR: N/A T-DaP: Given prenatally Flu: Given prenatally  Transfusion: No  Physical exam  Vitals:   05/29/22 1412 05/29/22 1700 05/29/22 2015 05/30/22 0501  BP: 127/70 115/71 125/61 111/74  Pulse: 94 83 86 78  Resp: 17  18 16   Temp: 98.3 F (36.8 C) 98.1 F (36.7 C) 98.4 F (36.9 C) 98.2 F (36.8 C)  TempSrc:   Oral Oral  SpO2: 100%      General: alert, cooperative, and no distress Lochia: appropriate Uterine Fundus: firm Incision: N/A DVT Evaluation: No evidence of DVT seen on physical exam.  Labs: Lab Results  Component Value Date   WBC 10.9 (H) 05/27/2022   HGB 9.4 (L) 05/27/2022   HCT 30.0 (L) 05/27/2022   MCV 72.5 (L) 05/27/2022   PLT 409 (H) 05/27/2022      Latest Ref Rng & Units 05/28/2022   11:41 AM  CMP  Glucose 70 - 99 mg/dL 89   BUN 6 - 20 mg/dL <5   Creatinine 0.44 - 1.00 mg/dL 0.47   Sodium 135 - 145 mmol/L 137   Potassium 3.5 - 5.1 mmol/L 3.6   Chloride 98 - 111 mmol/L 109   CO2 22 - 32 mmol/L 20   Calcium 8.9 - 10.3 mg/dL 8.2    Edinburgh Score:    05/29/2022   11:19 AM  Edinburgh Postnatal Depression Scale Screening Tool  I have been able to laugh and see the funny side of things. 0  I have looked  forward with enjoyment to things. 0  I have blamed myself unnecessarily when things went wrong. 0  I have been anxious or worried for no good reason. 0  I have felt scared or panicky for no good reason. 0  Things have been getting on top of me. 1  I have been so unhappy that I have had difficulty sleeping. 0  I have felt sad or miserable. 0  I have been so unhappy that I have been crying. 0  The thought of harming myself has occurred to me. 0  Edinburgh Postnatal Depression Scale Total 1    After visit meds:  Allergies as of 05/30/2022   No Known Allergies      Medication List     STOP taking these medications    Accu-Chek Guide Me w/Device Kit   Accu-Chek Guide test strip Generic drug: glucose blood   Accu-Chek Softclix  Lancets lancets   metFORMIN 500 MG tablet Commonly known as: Glucophage   Wrist Brace//Left Large Misc   Wrist Brace/Right Large Misc       TAKE these medications    betamethasone valerate 0.1 % cream Commonly known as: VALISONE Apply topically 2 (two) times daily.   famotidine 40 MG tablet Commonly known as: Pepcid Take 1 tablet (40 mg total) by mouth daily.   ibuprofen 600 MG tablet Commonly known as: ADVIL Take 1 tablet (600 mg total) by mouth every 6 (six) hours.   norethindrone 0.35 MG tablet Commonly known as: Ortho Micronor Take 1 tablet (0.35 mg total) by mouth daily.   pantoprazole 40 MG tablet Commonly known as: Protonix Take 1 tablet (40 mg total) by mouth daily.   Vitafol Ultra 29-0.6-0.4-200 MG Caps Take 1 capsule by mouth daily before breakfast.         Discharge home in stable condition Infant Feeding: Bottle and Breast Infant Disposition: home with mother Discharge instruction: per After Visit Summary and Postpartum booklet. Activity: Advance as tolerated. Pelvic rest for 6 weeks.  Diet: routine diet Future Appointments: Future Appointments  Date Time Provider [place]  07/14/2022  8:45 AM [facility] None  07/14/2022  9:15 AM Chancy Milroy, MD [place] None   Follow up Visit: Message sent to [place] by Dr. Gwenlyn Perking on 05/28/22.   Please schedule this patient for a In person postpartum visit in 6 weeks with the following provider: Any provider. Additional Postpartum F/U: 2 hour GTT  High risk pregnancy complicated by: GDM Delivery mode:  Vaginal, Spontaneous  Anticipated Birth Control:  POPs   05/30/2022 Renee Harder, CNM

## 2022-05-28 NOTE — Progress Notes (Signed)
L&D Note  05/28/2022 - 3:19 PM  31 y.o. G1 [redacted]w[redacted]d. Pregnancy complicated by GDMa2, marginal cord insertioin  Patient Active Problem List   Diagnosis Date Noted   Chorioamnionitis 05/28/2022   GDM, class A2 05/27/2022   Gestational diabetes mellitus (GDM) controlled on oral hypoglycemic drug 03/14/2022   Marginal insertion of umbilical cord affecting management of mother 01/31/2022   Supervision of high risk pregnancy, antepartum 01/03/2022    [name] is admitted for IOL   Subjective:  Feeling a little more pressure  Objective:   Vitals:   05/28/22 1340 05/28/22 1402 05/28/22 1432 05/28/22 1502  BP:  94/65 125/63 128/70  Pulse:  (!) 116 (!) 135 (!) 113  Resp:      Temp: (!) 101.1 F (38.4 C)     TempSrc: Axillary       Current Vital Signs 24h Vital Sign Ranges  T (!) 101.1 F (38.4 C) Temp  Avg: 99.4 F (37.4 C)  Min: 98.1 F (36.7 C)  Max: 101.1 F (38.4 C)  BP 128/70 BP  Min: 81/51  Max: 137/78  HR (!) 113 Pulse  Avg: 104.8  Min: 88  Max: 135  RR 18 Resp  Avg: 17.5  Min: 17  Max: 18  SaO2     No data recorded       24 Hour I/O Current Shift I/O  Time Ins Outs No intake/output data recorded. 06/11 0701 - 06/11 1900 In: -  Out: 2700 [Urine:2700]   FHR: 160 baseline, +scalp stim and now with mod variability Toco: q2-33m, MVUs 120-140 Gen: NAD SVE: complete, BPD +2 and presenting to +3  Labs:  Recent Labs  Lab 05/27/22 0800  WBC 10.9*  HGB 9.4*  HCT 30.0*  PLT 409*   Recent Labs  Lab 05/28/22 1141  NA 137  K 3.6  CL 109  CO2 20*  BUN <5*  CREATININE 0.47  CALCIUM 8.2*  GLUCOSE 89    Medications Current Facility-Administered Medications  Medication Dose Route Frequency Provider Last Rate Last Admin   acetaminophen (TYLENOL) tablet 650 mg  650 mg Oral Q4H PRN Serita Grammes D, CNM   650 mg at 05/28/22 1359   ampicillin (OMNIPEN) 2 g in sodium chloride 0.9 % 100 mL IVPB  2 g Intravenous Q6H Aletha Halim, MD 300 mL/hr  at 05/28/22 0938 2 g at 05/28/22 0938   diphenhydrAMINE (BENADRYL) injection 12.5 mg  12.5 mg Intravenous Q15 min PRN Genia Del, MD       ePHEDrine injection 10 mg  10 mg Intravenous PRN Genia Del, MD       ePHEDrine injection 10 mg  10 mg Intravenous PRN Genia Del, MD       fentaNYL (SUBLIMAZE) injection 100 mcg  100 mcg Intravenous Q1H PRN Serita Grammes D, CNM       fentaNYL 2 mcg/mL w/ bupivacaine 0.125% in NS 250 mL epidural infusion  12 mL/hr Epidural Continuous PRN Genia Del, MD 12 mL/hr at 05/28/22 0227 12 mL/hr at 05/28/22 0227   gentamicin (GARAMYCIN) 540 mg in dextrose 5 % 100 mL IVPB  5 mg/kg Intravenous Q24H Claudina Lick A, RPH 113.5 mL/hr at 05/28/22 1141 540 mg at 05/28/22 1141   lactated ringers amnioinfusion   Intrauterine Continuous Genia Del, MD 150 mL/hr at 05/28/22 1423 Rate Change at 05/28/22 1423   lactated ringers infusion 500-1,000 mL  500-1,000 mL Intravenous PRN Myrtis Ser, CNM 999 mL/hr at  05/28/22 0826 500 mL at 05/28/22 [id number]   lactated ringers infusion   Intravenous Continuous Myrtis Ser, CNM 125 mL/hr at 05/27/22 0753 New Bag at 05/27/22 0753   lidocaine (PF) (XYLOCAINE) 1 % injection 30 mL  30 mL Subcutaneous PRN Myrtis Ser, CNM       misoprostol (CYTOTEC) tablet 25 mcg  25 mcg Vaginal Q4H PRN Myrtis Ser, CNM   25 mcg at 05/27/22 [id number]   misoprostol (CYTOTEC) tablet 50 mcg  50 mcg Oral Q4H Genia Del, MD   50 mcg at 05/27/22 1349   ondansetron (ZOFRAN) injection 4 mg  4 mg Intravenous Q6H PRN Myrtis Ser, CNM   4 mg at 05/28/22 [id number]   oxyCODONE-acetaminophen (PERCOCET/ROXICET) 5-325 MG per tablet 1 tablet  1 tablet Oral Q4H PRN Myrtis Ser, CNM       oxyCODONE-acetaminophen (PERCOCET/ROXICET) 5-325 MG per tablet 2 tablet  2 tablet Oral Q4H PRN Myrtis Ser, CNM       oxytocin (PITOCIN) IV BOLUS FROM BAG  333 mL Intravenous Once Myrtis Ser, CNM       oxytocin  (PITOCIN) IV infusion 30 units in NS 500 mL - Premix  2.5 Units/hr Intravenous Continuous Serita Grammes D, CNM       oxytocin (PITOCIN) IV infusion 30 units in NS 500 mL - Premix  1-40 milli-units/min Intravenous Titrated Seabron Spates, CNM 9 mL/hr at 05/28/22 1516 9 milli-units/min at 05/28/22 1516   PHENYLephrine 80 mcg/ml in normal saline Adult IV Push Syringe (For Blood Pressure Support)  80 mcg Intravenous PRN Genia Del, MD       sodium citrate-citric acid (ORACIT) solution 30 mL  30 mL Oral Q2H PRN Myrtis Ser, CNM       terbutaline (BRETHINE) injection 0.25 mg  0.25 mg Subcutaneous Once PRN Myrtis Ser, CNM       tranexamic acid (CYKLOKAPRON) IVPB 1,000 mg  1,000 mg Intravenous Once PRN Seabron Spates, CNM       Facility-Administered Medications Ordered in Other Encounters  Medication Dose Route Frequency Provider Last Rate Last Admin   lidocaine (PF) (XYLOCAINE) 1 % injection   Epidural Anesthesia Intra-op Lynda Rainwater, MD   11 mL at 05/28/22 0227   CBG at 1404: 80 Assessment & Plan:  Pt progressing *Pregnancy: now category I but was category II *Labor: pt progressing well. I need to go back for a c/s so will decrease pit in half, let pt labor down and will start pushing once OR is complete.  *GDMa2: increase to q1-2h checks *Chorio: continue abx. Peds at delivery *Analgesia: epidural working  Interpreter used  Aletha Halim, Brooke Bonito. MD Attending Center for [employer] manager)

## 2022-05-28 NOTE — Lactation Note (Signed)
This note was copied from a baby's chart. Lactation Consultation Note  Patient Name: Boy Rajni Holsworth [id number]'[id number] Date: 05/28/2022 Reason for consult: L&D Initial assessment;Term Age:30 hours   Initial L&D Consult:  Visited with family < 1 hour after birth Attempted to latch baby, however, he was not at all interested in opening his mouth for latching.  Reassurance given.  Placed him STS on mother's chest.  Allowed time for family bonding.  Parents aware that lactation will follow up on the M/B unit.   Maternal Data    Feeding Mother's Current Feeding Choice: Breast Milk  LATCH Score Latch: Too sleepy or reluctant, no latch achieved, no sucking elicited.  Audible Swallowing: None  Type of Nipple: Everted at rest and after stimulation  Comfort (Breast/Nipple): Soft / non-tender  Hold (Positioning): Assistance needed to correctly position infant at breast and maintain latch.  LATCH Score: 5   Lactation Tools Discussed/Used    Interventions Interventions: Skin to skin;Assisted with latch  Discharge    Consult Status Consult Status: Follow-up from L&D    Irene Pap Zollie Clemence 05/28/2022, 6:48 PM

## 2022-05-28 NOTE — Progress Notes (Signed)
Pharmacy Antibiotic Note  Kiara Webster Kiara Webster is a 31 y.o. female admitted on 05/27/2022 with  chorioamnionitis .  Pharmacy has been consulted for gentamicin dosing.  Plan: Stat ampicillin 2g IV q6h per MD Start gentamicin 540 mg (5 mg/kg) IV q24h  Monitor renal function and clinical status     Temp (24hrs), Avg:99.2 F (37.3 C), Min:98.1 F (36.7 C), Max:100.4 F (38 C)  Recent Labs  Lab 05/27/22 0800  WBC 10.9*    CrCl cannot be calculated (No successful lab value found.).    No Known Allergies  Antimicrobials this admission: Ampicillin 2g IV q6h 6/11>>  Gentamicin 540 mg (5 mg/kg) IV q24h 6/11>>   Dose adjustments this admission: N/A  Microbiology results:  Thank you for allowing pharmacy to be a part of this patient's care.  Claudina Lick, PharmD PGY2 Pediatric Pharmacy Resident 05/28/2022 11:10 AM  Please check [email or web address] for unit-specific pharmacy phone numbers.

## 2022-05-29 LAB — GLUCOSE, CAPILLARY: Glucose-Capillary: 85 mg/dL (ref 70–99)

## 2022-05-29 NOTE — Anesthesia Postprocedure Evaluation (Signed)
Anesthesia Post Note  Patient: Kiara Webster  Procedure(s) Performed: AN AD [place]     Patient location during evaluation: Mother Baby Anesthesia Type: Epidural Level of consciousness: awake Pain management: satisfactory to patient Vital Signs Assessment: post-procedure vital signs reviewed and stable Respiratory status: spontaneous breathing Cardiovascular status: stable Anesthetic complications: no   No notable events documented.  Last Vitals:  Vitals:   05/29/22 0055 05/29/22 0602  BP:  125/66  Pulse:    Resp: 18 18  Temp:    SpO2:  100%    Last Pain:  Vitals:   05/29/22 0804  TempSrc:   PainSc: 0-No pain   Pain Goal: Patients Stated Pain Goal: 0 (05/27/22 2106)                 Casimer Lanius

## 2022-05-29 NOTE — Lactation Note (Signed)
This note was copied from a baby's chart. Lactation Consultation Note  Patient Name: Kiara Webster [id number] Date: 05/29/2022 Reason for consult: Follow-up assessment;Primapara;1st time breastfeeding;Term;Breastfeeding assistance Age:30 hours  P1, Term, Infant Female  [employer] [phone] used.  LC entered the room and RN was assessing baby.   Per mom she has not been breastfeeding much because she has not seen much milk when hand expressing. LC spoke with mom about infant stomach size and encouraged her to hand express and breastfeeding to increase milk volume.   Mom states that she was told that she should feed baby every 4 hours. LC informed mom that we are watching baby's feeding cues and we should feed him when he shows cues.   LC also spoke with mom about putting baby to the breast prior to giving formula and reviewed the supplementation guidelines sheet with her.   Mom states that she has no further questions or concerns and will call for assistance with latch.   Current Feeding Plan:  Breastfeed baby according to feeding cues 8+times in 24 hours according to feeding cues.  Breastfeed prior to formula feeding.  Feed baby formula according to supplementation guidelines.  Hand express for added stimulation and feed expressed milk to baby.  Call for latch assistance.   Maternal Data Has patient been taught Hand Expression?: Yes Does the patient have breastfeeding experience prior to this delivery?: No  Feeding Mother's Current Feeding Choice: Breast Milk and Formula  LATCH Score                    Lactation Tools Discussed/Used    Interventions Interventions: Breast feeding basics reviewed;Education  Discharge    Consult Status Consult Status: Follow-up Date: 05/30/22 Follow-up type: In-patient    Delene Loll 05/29/2022, 2:41 PM

## 2022-05-29 NOTE — Lactation Note (Addendum)
This note was copied from a baby's chart. Lactation Consultation Note  Patient Name: Kiara Webster [id number] Date: 05/29/2022 Reason for consult: Follow-up assessment;Mother's request;Difficult latch Age:30 hours In house Spanish Interpreter used [place]  Mom attempt to latch infant on her left breast multiple times, infant would not sustain latch. LC fitted mom with 20 mm NS, that was pre-filled with 0.5 mls of formula, infant sustained latch on mom's left breast using the cradle hold. Infant was supplemented with 8 mls of formula at the breast and was still breastfeeding after 25 minutes when LC left the room. Due using 20 mm NS, mom was set up with DEBP. Mom will pump 6 times within 24 hours.  Mom shown how to use DEBP & how to disassemble, clean, & reassemble parts.  Mom knows to call RN/LC if she need further latch assistance or help with apply 20 mm NS.  Mom's plan: 1- Apply 20 mm NS prior to latching infant, continue to breastfeed infant according to hunger cues, on demand, 8 to 12+ times within 24 hours, skin to skin. 2- If infant is still cuing she will supplement infant with formula her choice, mom was given breastfeeding supplemental sheet and knows Day 1 offer 5-7 mls  after latching infant at the breast. 3- Mom will use DEBP 6 times within 24 hours and give infant back any EBM first before offering formula.  Maternal Data Has patient been taught Hand Expression?: Yes Does the patient have breastfeeding experience prior to this delivery?: No  Feeding Mother's Current Feeding Choice: Breast Milk and Formula  LATCH Score Latch: Grasps breast easily, tongue down, lips flanged, rhythmical sucking.  Audible Swallowing: A few with stimulation  Type of Nipple: Flat  Comfort (Breast/Nipple): Soft / non-tender  Hold (Positioning): Assistance needed to correctly position infant at breast and maintain latch.  LATCH Score: 7   Lactation Tools  Discussed/Used Tools: Pump Breast pump type: Double-Electric Breast Pump Pump Education: Setup, frequency, and cleaning;Milk Storage Reason for Pumping: Due to using 20 mm NS Pumping frequency: Mom plans to pump 6 times within 24 hours.  Interventions Interventions: Breast feeding basics reviewed;Education  Discharge    Consult Status Consult Status: Follow-up Date: 05/30/22 Follow-up type: In-patient    Danelle Earthly 05/29/2022, 6:02 PM

## 2022-05-29 NOTE — Progress Notes (Signed)
Post Partum Day 1 Subjective: no complaints, up ad lib, voiding, and tolerating PO, pain controlled  Objective: Blood pressure 110/79, pulse 90, temperature 97.7 F (36.5 C), temperature source Oral, resp. rate 16, last menstrual period 08/27/2021, SpO2 99 %, unknown if currently breastfeeding.  Physical Exam:  General: alert, cooperative, and no distress Lochia: appropriate Uterine Fundus: firm Incision: n/a DVT Evaluation: No evidence of DVT seen on physical exam. Negative Homan's sign. No cords or calf tenderness. No significant calf/ankle edema.  Recent Labs    05/27/22 0800  HGB 9.4*  HCT 30.0*    Assessment/Plan: Plan for discharge tomorrow, Breastfeeding, Lactation consult, and Contraception POPs A2GDM, delivered Triple I; afebrile  Spanish interpreter present   LOS: 2 days  Julianne Handler 05/29/2022, 11:34 AM

## 2022-05-30 LAB — GLUCOSE, CAPILLARY: Glucose-Capillary: 71 mg/dL (ref 70–99)

## 2022-05-30 MED ORDER — IBUPROFEN 600 MG PO TABS: 600.0000 mg | ORAL_TABLET | Freq: Four times a day (QID) | ORAL | 0 refills | Status: DC

## 2022-05-30 MED ORDER — NORETHINDRONE 0.35 MG PO TABS: 1.0000 | ORAL_TABLET | Freq: Every day | ORAL | 11 refills | Status: DC

## 2022-06-05 ENCOUNTER — Telehealth (HOSPITAL_COMMUNITY): Payer: Self-pay

## 2022-06-05 NOTE — Telephone Encounter (Signed)
"  I'm good." Patient declines questions or concerns about her healing.  "He's good. His cord hasn't fallen off yet. When should the cord fall off?" RN reviewed cord care and bathing. "I haven't heard anything about a hearing screen? They said they would call me." RN told patient to reach out to her pediatrician about hearing screen. "Is it ok to turn on my air conditioning? Sometimes I notice he is hot." RN told patient it is recommended to keep the temperature in home between 70-75 and to avoid overheating. "Baby sleeps in a crib." RN reviewed ABC's of safe sleep with patient. Patient declines any questions or concerns about baby.  EPDS score is 0.  Marcelino Duster [facility] 06/19//2023,1944

## 2022-07-12 NOTE — Progress Notes (Signed)
Patient was assessed and managed by nursing staff during this encounter. I have reviewed the chart and agree with the documentation and plan. I have also made any necessary editorial changes.  Scheryl Darter, MD 07/12/2022 2:30 PM

## 2022-07-14 ENCOUNTER — Encounter: Payer: Self-pay | Admitting: Obstetrics and Gynecology

## 2022-07-14 ENCOUNTER — Other Ambulatory Visit: Payer: Medicaid Other

## 2022-07-14 ENCOUNTER — Ambulatory Visit (INDEPENDENT_AMBULATORY_CARE_PROVIDER_SITE_OTHER): Payer: Medicaid Other | Admitting: Obstetrics and Gynecology

## 2022-07-14 VITALS — BP 125/80 | HR 66 | Wt 213.0 lb

## 2022-07-14 DIAGNOSIS — O24435 Gestational diabetes mellitus in puerperium, controlled by oral hypoglycemic drugs: Secondary | ICD-10-CM

## 2022-07-14 DIAGNOSIS — O24415 Gestational diabetes mellitus in pregnancy, controlled by oral hypoglycemic drugs: Secondary | ICD-10-CM

## 2022-07-14 NOTE — Patient Instructions (Signed)

## 2022-07-14 NOTE — Progress Notes (Signed)
    Post Partum Visit Note  Kiara Webster is a 31 y.o. [id number] female who presents for a postpartum visit. She is 6 week postpartum following a normal spontaneous vaginal delivery.  I have fully reviewed the prenatal and intrapartum course. The delivery was at [redacted]w[redacted]d gestational weeks.  Anesthesia: epidural. Postpartum course has been unremarkable. Baby is doing well. Baby is feeding by both breast and bottle - Daron Offer . Bleeding no bleeding. Bowel function is normal. Bladder function is normal. Patient is sexually active. Last unprotected IC was 2 days ago. Contraception method is none. Postpartum depression screening: negative. EPDS=0   The pregnancy intention screening data noted above was reviewed. Potential methods of contraception were discussed. The patient elected to proceed with No data recorded.    Health Maintenance Due  Topic Date Due   COVID-19 Vaccine (1) Never done   URINE MICROALBUMIN  Never done   Medical record reviewed  Review of Systems Pertinent items noted in HPI and remainder of comprehensive ROS otherwise negative.  Objective:  LMP 08/27/2021    General:  alert   Breasts:  not indicated  Lungs: clear to auscultation bilaterally  Heart:  regular rate and rhythm, S1, S2 normal, no murmur, click, rub or gallop  Abdomen: soft, non-tender; bowel sounds normal; no masses,  no organomegaly   Wound NA  GU exam:  not indicated       Assessment:    There are no diagnoses linked to this encounter.  NL postpartum exam.   Plan:   Essential components of care per ACOG recommendations:  1.  Mood and well being: Patient with negative depression screening today. Reviewed local resources for support.  - Patient tobacco use? No.   - hx of drug use? No.    2. Infant care and feeding:  -Patient currently breastmilk feeding? Yes -Social determinants of health (SDOH) reviewed in EPIC. No concerns  3. Sexuality, contraception and birth  spacing - Patient does not want a pregnancy in the next year.  Desired family size is uncertain  - Reviewed reproductive life planning. Reviewed contraceptive methods based on pt preferences and effectiveness.  Patient desired Oral Contraceptive today.   - Discussed birth spacing of 18 months  4. Sleep and fatigue -Encouraged family/partner/community support of 4 hrs of uninterrupted sleep to help with mood and fatigue  5. Physical Recovery  - Discussed patients delivery and complications. She describes her labor as good. - Patient had a Vaginal, no problems at delivery. Patient had a 1st degree laceration. Perineal healing reviewed. Patient expressed understanding - Patient has urinary incontinence? No. - Patient is safe to resume physical and sexual activity  6.  Health Maintenance - HM due items addressed Yes - Last pap smear  Diagnosis  Date Value Ref Range Status  01/03/2022   Final   - Negative for intraepithelial lesion or malignancy (NILM)   Pap smear not done at today's visit.  -Breast Cancer screening indicated? No.   7. Chronic Disease/Pregnancy Condition follow up: Gestational Diabetes  2 hr Glucola today   Center for [employer], [facility]

## 2022-07-15 LAB — GLUCOSE TOLERANCE, 2 HOURS
Glucose, 2 hour: 97 mg/dL (ref 70–139)
Glucose, GTT - Fasting: 78 mg/dL (ref 70–99)

## 2022-07-17 ENCOUNTER — Telehealth: Payer: Self-pay

## 2022-07-17 NOTE — Telephone Encounter (Signed)
Called pt using interpreter (ID number [phone]) pt made aware that she passed her 2 hr postpartum glucose test. Pt states that is wonderful and has no further questions.

## 2022-07-17 NOTE — Telephone Encounter (Signed)
-----   Message from Hermina Staggers, MD sent at 07/15/2022  5:06 PM EDT ----- Please let pt know that she passed her Glucola test.  Thanks Casimiro Needle

## 2022-08-12 ENCOUNTER — Ambulatory Visit
Admission: EM | Admit: 2022-08-12 | Discharge: 2022-08-12 | Disposition: A | Discharge status: Home / Self Care | Payer: Medicaid Other

## 2022-08-12 DIAGNOSIS — R030 Elevated blood-pressure reading, without diagnosis of hypertension: Secondary | ICD-10-CM

## 2022-08-12 NOTE — ED Notes (Signed)
PT ASSISTED IN FINDING A PCP (APPOINTMENT SCHEDULED).

## 2022-08-12 NOTE — ED Triage Notes (Addendum)
The patient states her BP has been elevated, she states she has been taking birth control pill and ever since then her BP (170/102) has been elevated, so she stopped. The patient states she has been have dizziness and recently gave birth in June.   The pt denies chest pain but has some chest pressure (central). The patient denies numbness and tingling in extremities.   The patient does breastfeed.

## 2022-08-12 NOTE — ED Provider Notes (Signed)
[facility]    CSN: [id number] Arrival date & time: 08/12/22  1039    HISTORY   Chief Complaint  Patient presents with   Hypertension   HPI Kiara Webster is a pleasant, 31 y.o. female who presents to urgent care today. The patient states her BP has been elevated, she states she has been taking birth control pill and ever since then her BP (170/102) has been elevated, so she stopped. The patient states she has been have dizziness and recently gave birth in June. The pt denies chest pain but has some chest pressure (central). The patient denies numbness and tingling in extremities. The patient does breastfeed.   The history is provided by the patient. A language interpreter was used.   Past Medical History:  Diagnosis Date   Gestational diabetes    Medical history non-contributory    Vaginal delivery 05/28/2022   Patient Active Problem List   Diagnosis Date Noted   Postpartum care following vaginal delivery 07/14/2022   Gestational diabetes mellitus (GDM) controlled on oral hypoglycemic drug 03/14/2022   Past Surgical History:  Procedure Laterality Date   NO PAST SURGERIES     OB History     Gravida  1   Para  1   Term  1   Preterm      AB      Living  1      SAB      IAB      Ectopic      Multiple  0   Live Births  1          Home Medications    Prior to Admission medications   Medication Sig Start Date End Date Taking? Authorizing Provider  Prenat-Fe Poly-Methfol-FA-DHA (VITAFOL ULTRA) 29-0.6-0.4-200 MG CAPS Take 1 capsule by mouth daily before breakfast. 01/31/22   Constant, Peggy, MD    Family History Family History  Problem Relation Age of Onset   Cancer Neg Hx    Hypertension Neg Hx    Social History Social History   Tobacco Use   Smoking status: Never   Smokeless tobacco: Never  Vaping Use   Vaping Use: Never used  Substance Use Topics   Alcohol use: Not Currently   Drug use: Not Currently    Allergies   Patient has no known allergies.  Review of Systems Review of Systems Pertinent findings revealed after performing a 14 point review of systems has been noted in the history of present illness.  Physical Exam Triage Vital Signs ED Triage Vitals  Enc Vitals Group     BP 10/14/21 0827 (!) 147/82     Pulse Rate 10/14/21 0827 72     Resp 10/14/21 0827 18     Temp 10/14/21 0827 98.3 F (36.8 C)     Temp Source 10/14/21 0827 Oral     SpO2 10/14/21 0827 98 %     Weight --      Height --      Head Circumference --      Peak Flow --      Pain Score 10/14/21 0826 5     Pain Loc --      Pain Edu? --      Excl. in GC? --   No data found.  Updated Vital Signs BP (!) 139/92 (BP Location: Left Arm)   Pulse 79   Temp 98.8 F (37.1 C) (Oral)   Resp 16   SpO2 97%   Breastfeeding  Yes   Physical Exam Vitals and nursing note reviewed.  Constitutional:      General: She is not in acute distress.    Appearance: Normal appearance. She is not ill-appearing.  HENT:     Head: Normocephalic and atraumatic.  Eyes:     General: Lids are normal.        Right eye: No discharge.        Left eye: No discharge.     Extraocular Movements: Extraocular movements intact.     Conjunctiva/sclera: Conjunctivae normal.     Right eye: Right conjunctiva is not injected.     Left eye: Left conjunctiva is not injected.  Neck:     Trachea: Trachea and phonation normal.  Cardiovascular:     Rate and Rhythm: Normal rate and regular rhythm.     Pulses: Normal pulses.     Heart sounds: Normal heart sounds. No murmur heard.    No friction rub. No gallop.  Pulmonary:     Effort: Pulmonary effort is normal. No accessory muscle usage, prolonged expiration or respiratory distress.     Breath sounds: Normal breath sounds. No stridor, decreased air movement or transmitted upper airway sounds. No decreased breath sounds, wheezing, rhonchi or rales.  Chest:     Chest wall: No tenderness.   Musculoskeletal:        General: Normal range of motion.     Cervical back: Normal range of motion and neck supple. Normal range of motion.  Lymphadenopathy:     Cervical: No cervical adenopathy.  Skin:    General: Skin is warm and dry.     Findings: No erythema or rash.  Neurological:     General: No focal deficit present.     Mental Status: She is alert and oriented to person, place, and time.  Psychiatric:        Mood and Affect: Mood normal.        Behavior: Behavior normal.     Visual Acuity Right Eye Distance:   Left Eye Distance:   Bilateral Distance:    Right Eye Near:   Left Eye Near:    Bilateral Near:     UC Couse / Diagnostics / Procedures:     Radiology No results found.  Procedures Procedures (including critical care time) EKG  Pending results:  Labs Reviewed - No data to display  Medications Ordered in UC: Medications - No data to display  UC Diagnoses / Final Clinical Impressions(s)   I have reviewed the triage vital signs and the nursing notes.  Pertinent labs & imaging results that were available during my care of the patient were reviewed by me and considered in my medical decision making (see chart for details).    Final diagnoses:  Elevated blood pressure reading in office without diagnosis of hypertension   Patient advised to follow-up with her primary care provider regarding her hypertension.  Patient was provided with information about hypertension, cooking with less salt and DASH diet eating plan for weight loss.  Return precautions advised.  ED Prescriptions   None    PDMP not reviewed this encounter.  Pending results:  Labs Reviewed - No data to display  Discharge Instructions:   Discharge Instructions      Su presin arterial est levemente elevada hoy. Lo ms probable es que esto se deba a la cantidad de sal en su dieta. Tengo una gua para pacientes sobre hipertensin y cmo cocinar con menos sal que espero que les  resulte til. Tambin se recomienda perder peso.  Haga un seguimiento con su proveedor de atencin primaria sobre su presin arterial. Aqu en atencin de urgencia no recetamos ni resurtimos medicamentos para la presin arterial.  Gracias por visitar la atencin de urgencia hoy.  Your blood pressure is mildly elevated today.  This is more than likely due to the amount of salt in your diet.  I have placement for patient about hypertension and cooking with less salt that I hope you find helpful.  Weight loss is also recommended.    Please follow-up with your primary care provider about your blood pressure.  We do not prescribe or refill blood pressure medications here at urgent care.  Thank you for visiting urgent care today.    Disposition Upon Discharge:  Condition: stable for discharge home  Patient presented with an acute illness with associated systemic symptoms and significant discomfort requiring urgent management. In my opinion, this is a condition that a prudent lay person (someone who possesses an average knowledge of health and medicine) may potentially expect to result in complications if not addressed urgently such as respiratory distress, impairment of bodily function or dysfunction of bodily organs.   Routine symptom specific, illness specific and/or disease specific instructions were discussed with the patient and/or caregiver at length.   As such, the patient has been evaluated and assessed, work-up was performed and treatment was provided in alignment with urgent care protocols and evidence based medicine.  Patient/parent/caregiver has been advised that the patient may require follow up for further testing and treatment if the symptoms continue in spite of treatment, as clinically indicated and appropriate.  Patient/parent/caregiver has been advised to return to the [facility] or PCP if no better; to PCP or the Emergency Department if new signs and symptoms develop, or if the current  signs or symptoms continue to change or worsen for further workup, evaluation and treatment as clinically indicated and appropriate  The patient will follow up with their current PCP if and as advised. If the patient does not currently have a PCP we will assist them in obtaining one.   The patient may need specialty follow up if the symptoms continue, in spite of conservative treatment and management, for further workup, evaluation, consultation and treatment as clinically indicated and appropriate.   Patient/parent/caregiver verbalized understanding and agreement of plan as discussed.  All questions were addressed during visit.  Please see discharge instructions below for further details of plan.  This office note has been dictated using Teaching laboratory technician.  Unfortunately, this method of dictation can sometimes lead to typographical or grammatical errors.  I apologize for your inconvenience in advance if this occurs.  Please do not hesitate to reach out to me if clarification is needed.      Theadora Rama Scales, PA-C 08/12/22 1156

## 2022-08-12 NOTE — Discharge Instructions (Addendum)
Su presin arterial est levemente elevada hoy. Lo ms probable es que esto se deba a la cantidad de sal en su dieta. Tengo una gua para pacientes sobre hipertensin y cmo cocinar con menos sal que espero que les resulte til. Tambin se recomienda perder peso.  Haga un seguimiento con su proveedor de atencin primaria sobre su presin arterial. Aqu en atencin de urgencia no recetamos ni resurtimos medicamentos para la presin arterial.  Gracias por visitar la atencin de urgencia hoy.  Your blood pressure is mildly elevated today.  This is more than likely due to the amount of salt in your diet.  I have placement for patient about hypertension and cooking with less salt that I hope you find helpful.  Weight loss is also recommended.    Please follow-up with your primary care provider about your blood pressure.  We do not prescribe or refill blood pressure medications here at urgent care.  Thank you for visiting urgent care today.

## 2022-08-24 ENCOUNTER — Telehealth: Payer: Self-pay

## 2022-08-24 ENCOUNTER — Ambulatory Visit: Payer: Medicaid Other | Admitting: Family Medicine

## 2022-08-24 ENCOUNTER — Telehealth: Payer: Self-pay | Admitting: Family Medicine

## 2022-08-24 NOTE — Telephone Encounter (Signed)
Pt was a no show for a NP app on 08/24/22 with Dr. Janee Morn, I sent a letter.

## 2022-08-24 NOTE — Telephone Encounter (Signed)
error 

## 2022-08-25 NOTE — Telephone Encounter (Signed)
No show for new patient visit, Medicaid ins, pt dismissed from [facility] GO

## 2022-09-17 ENCOUNTER — Emergency Department (HOSPITAL_COMMUNITY): Payer: Medicaid Other

## 2022-09-17 ENCOUNTER — Other Ambulatory Visit: Payer: Self-pay

## 2022-09-17 ENCOUNTER — Encounter (HOSPITAL_COMMUNITY): Payer: Self-pay

## 2022-09-17 ENCOUNTER — Emergency Department (HOSPITAL_COMMUNITY)
Admission: EM | Admit: 2022-09-17 | Discharge: 2022-09-18 | Disposition: A | Discharge status: Home / Self Care | Payer: Medicaid Other | Attending: Emergency Medicine | Admitting: Emergency Medicine

## 2022-09-17 DIAGNOSIS — R519 Headache, unspecified: Secondary | ICD-10-CM | POA: Diagnosis not present

## 2022-09-17 DIAGNOSIS — N9489 Other specified conditions associated with female genital organs and menstrual cycle: Secondary | ICD-10-CM | POA: Diagnosis not present

## 2022-09-17 DIAGNOSIS — R0689 Other abnormalities of breathing: Secondary | ICD-10-CM | POA: Diagnosis not present

## 2022-09-17 DIAGNOSIS — R0789 Other chest pain: Secondary | ICD-10-CM | POA: Diagnosis not present

## 2022-09-17 DIAGNOSIS — R5383 Other fatigue: Secondary | ICD-10-CM | POA: Diagnosis not present

## 2022-09-17 LAB — BASIC METABOLIC PANEL
Anion gap: 6 (ref 5–15)
BUN: 9 mg/dL (ref 6–20)
CO2: 23 mmol/L (ref 22–32)
Calcium: 9.2 mg/dL (ref 8.9–10.3)
Chloride: 111 mmol/L (ref 98–111)
Creatinine, Ser: 0.41 mg/dL — ABNORMAL LOW (ref 0.44–1.00)
GFR, Estimated: >60 mL/min (ref >60)
Glucose, Bld: 100 mg/dL — ABNORMAL HIGH (ref 70–99)
Potassium: 3.5 mmol/L (ref 3.5–5.1)
Sodium: 140 mmol/L (ref 135–145)

## 2022-09-17 LAB — CBC
HCT: 39.5 % (ref 36.0–46.0)
Hemoglobin: 12.6 g/dL (ref 12.0–15.0)
MCH: 24.7 pg — ABNORMAL LOW (ref 26.0–34.0)
MCHC: 31.9 g/dL (ref 30.0–36.0)
MCV: 77.5 fL — ABNORMAL LOW (ref 80.0–100.0)
Platelets: 353 10*3/uL (ref 150–400)
RBC: 5.1 MIL/uL (ref 3.87–5.11)
RDW: 17.4 % — ABNORMAL HIGH (ref 11.5–15.5)
WBC: 10.7 10*3/uL — ABNORMAL HIGH (ref 4.0–10.5)
nRBC: 0 % (ref 0.0–0.2)

## 2022-09-17 LAB — I-STAT BETA HCG BLOOD, ED (MC, WL, AP ONLY): I-stat hCG, quantitative: <5 m[IU]/mL (ref <5)

## 2022-09-17 LAB — TROPONIN I (HIGH SENSITIVITY): Troponin I (High Sensitivity): <2 ng/L (ref <18)

## 2022-09-17 NOTE — ED Provider Triage Note (Signed)
Emergency Medicine Provider Triage Evaluation Note  Kiara Webster , a 31 y.o. female  was evaluated in triage.  Pt complains of headache, blurry vision, chest pain, cough, nasal congestion, and elevated BP. Highest recorded at home with systolics in the 294. Postpartum, started taking birth control at 6 weeks post-delivery but only took it for about a month before discontinuing because she started not feeling well.  Spanish interpreter used in triage  Review of Systems  Positive: As above Negative: Leg swelling  Physical Exam  BP (!) 171/111 (BP Location: Left Arm)   Pulse (!) 110   Temp 98.3 F (36.8 C) (Oral)   Resp 18   Wt 96.6 kg   SpO2 100%   BMI 40.25 kg/m  Gen:   Awake, no distress   Resp:  Normal effort  MSK:   Moves extremities without difficulty  Other:    Medical Decision Making  Medically screening exam initiated at 9:15 PM.  Appropriate orders placed.  Kiara Webster was informed that the remainder of the evaluation will be completed by another provider, this initial triage assessment does not replace that evaluation, and the importance of remaining in the ED until their evaluation is complete.  Workup initiated   Kiara Webster, Kiara Webster 09/17/22 2117

## 2022-09-17 NOTE — ED Triage Notes (Signed)
Pt reports with HTN, headache, and not feeling well x 1 month. Pt reports starting birth control but stopped it because that's when she started feeling bad.

## 2022-09-18 LAB — BRAIN NATRIURETIC PEPTIDE: B Natriuretic Peptide: 16.7 pg/mL (ref 0.0–100.0)

## 2022-09-18 NOTE — Discharge Instructions (Signed)
Drink plenty of fluids and get plenty of rest.  Follow-up with your primary doctor if your symptoms are not improving in the next week.

## 2022-09-18 NOTE — ED Provider Notes (Signed)
[facility]EM[facility]ENC[facility]PT Provider Note   CSN: [id number] Arrival date & time: 09/17/22  2030     History  Chief Complaint  Patient presents with   Hypertension   Headache    Marji Kuehnel Sueko Dimichele is a 31 y.o. female.  Patient is a 31 year old female who is 3 months postpartum from her first child.  She presents today for evaluation of multiple complaints.  She describes tightness in her chest, fatigue, headaches, and having no energy.  This has been ongoing for the past several weeks.  Patient reports having interrupted sleep patterns due to breast feeding of the child.  She also reports no fevers or chills.  She reports elevated blood pressures at home.  Patient speaks little [place], mainly [place].  History taken with the assistance of the translator tablet.  The history is provided by the patient.       Home Medications Prior to Admission medications   Medication Sig Start Date End Date Taking? Authorizing Provider  Prenat-Fe Poly-Methfol-FA-DHA (VITAFOL ULTRA) 29-0.6-0.4-200 MG CAPS Take 1 capsule by mouth daily before breakfast. 01/31/22   Constant, Gigi Gin, MD      Allergies    Patient has no known allergies.    Review of Systems   Review of Systems  All other systems reviewed and are negative.   Physical Exam Updated Vital Signs BP (!) 137/94   Pulse 96   Temp 98.3 F (36.8 C) (Oral)   Resp 18   Wt 96.6 kg   SpO2 98%   BMI 40.25 kg/m  Physical Exam Vitals and nursing note reviewed.  Constitutional:      General: She is not in acute distress.    Appearance: She is well-developed. She is not diaphoretic.  HENT:     Head: Normocephalic and atraumatic.  Eyes:     Extraocular Movements: Extraocular movements intact.     Pupils: Pupils are equal, round, and reactive to light.  Cardiovascular:     Rate and Rhythm: Normal rate and regular rhythm.     Heart sounds: No murmur heard.    No friction rub. No gallop.  Pulmonary:      Effort: Pulmonary effort is normal. No respiratory distress.     Breath sounds: Normal breath sounds. No wheezing.  Abdominal:     General: Bowel sounds are normal. There is no distension.     Palpations: Abdomen is soft.     Tenderness: There is no abdominal tenderness.  Musculoskeletal:        General: Normal range of motion.     Cervical back: Normal range of motion and neck supple.  Skin:    General: Skin is warm and dry.  Neurological:     General: No focal deficit present.     Mental Status: She is alert and oriented to person, place, and time.     Cranial Nerves: No cranial nerve deficit or dysarthria.     ED Results / Procedures / Treatments   Labs (all labs ordered are listed, but only abnormal results are displayed) Labs Reviewed  BASIC METABOLIC PANEL - Abnormal; Notable for the following components:      Result Value   Glucose, Bld 100 (*)    Creatinine, Ser 0.41 (*)    All other components within normal limits  CBC - Abnormal; Notable for the following components:   WBC 10.7 (*)    MCV 77.5 (*)    MCH 24.7 (*)    RDW 17.4 (*)  All other components within normal limits  BRAIN NATRIURETIC PEPTIDE  I-STAT BETA HCG BLOOD, ED ([facility], [facility], [facility] ONLY)  TROPONIN I (HIGH SENSITIVITY)  TROPONIN I (HIGH SENSITIVITY)    EKG EKG Interpretation  Date/Time:  Sunday September 17 2022 21:33:08 EDT Ventricular Rate:  93 PR Interval:  158 QRS Duration: 109 QT Interval:  361 QTC Calculation: 449 R Axis:   -3 Text Interpretation: Sinus rhythm Borderline T abnormalities, anterior leads Confirmed by Veryl Speak [phone]) on 09/17/2022 11:32:55 PM  Radiology CT Head Wo Contrast  Result Date: 09/17/2022 CLINICAL DATA:  Hypertension and headache. EXAM: CT HEAD WITHOUT CONTRAST TECHNIQUE: Contiguous axial images were obtained from the base of the skull through the vertex without intravenous contrast. RADIATION DOSE REDUCTION: This exam was performed according to the departmental  dose-optimization program which includes automated exposure control, adjustment of the mA and/or kV according to patient size and/or use of iterative reconstruction technique. COMPARISON:  None Available. FINDINGS: Brain: No evidence of acute infarction, hemorrhage, hydrocephalus, extra-axial collection or mass lesion/mass effect. Vascular: No hyperdense vessel or unexpected calcification. Skull: Normal. Negative for fracture or focal lesion. Sinuses/Orbits: No acute finding. Other: None. IMPRESSION: No acute intracranial pathology. Electronically Signed   By: Virgina Norfolk M.D.   On: 09/17/2022 23:54   DG Chest 2 View  Result Date: 09/17/2022 CLINICAL DATA:  Chest pain, hypertension. EXAM: CHEST - 2 VIEW COMPARISON:  None Available. FINDINGS: The heart size and mediastinal contours are within normal limits. Lung volumes are low resulting in vascular crowding. No consolidation, effusion, or pneumothorax. No acute osseous abnormality. IMPRESSION: No active cardiopulmonary disease. Electronically Signed   By: Brett Fairy M.D.   On: 09/17/2022 21:39    Procedures Procedures    Medications Ordered in ED Medications - No data to display  ED Course/ Medical Decision Making/ A&P  Patient presenting here with multiple complaints as described in the HPI.  She is 3 months postpartum from the birth of her first child and I suspect her symptoms are all related to interruptions in her sleep patterns as well as hormonal changes.  She is currently breast-feeding and wakes up frequently for this.  She was also started on an oral contraceptive after the delivery of the child and believes this medication is also making her feel poorly.  Patient's work-up today shows no significant abnormality.  Her blood counts and electrolytes are all normal.  EKG shows nonspecific T wave abnormalities, but there are no prior EKGs for comparison.  CT scan of the head is unremarkable and chest x-ray is negative.  At this  point, patient has a negative work-up and I feel can safely be discharged to home.  Her blood pressure was initially elevated upon presentation, however normalized upon repeat.  She is greater than 3 months postpartum and I believe out of the window for preeclampsia.  To follow-up with her primary doctor in the next week if things are not improving.  Final Clinical Impression(s) / ED Diagnoses Final diagnoses:  None    Rx / DC Orders ED Discharge Orders     None         Veryl Speak, MD 09/18/22 [phone]

## 2023-04-04 ENCOUNTER — Other Ambulatory Visit: Payer: Self-pay

## 2023-04-04 DIAGNOSIS — Z3480 Encounter for supervision of other normal pregnancy, unspecified trimester: Secondary | ICD-10-CM

## 2023-04-04 MED ORDER — VITAFOL ULTRA 29-0.6-0.4-200 MG PO CAPS: 1.0000 | ORAL_CAPSULE | Freq: Every day | ORAL | 4 refills | Status: AC

## 2023-06-10 IMAGING — US US MFM OB FOLLOW-UP
1 series · 12 of 28 positions shown · non-contrast
Comparison: none

[Series 1: us mfm ob follow-up · 12 of 52 slices shown]
[im 2/52]
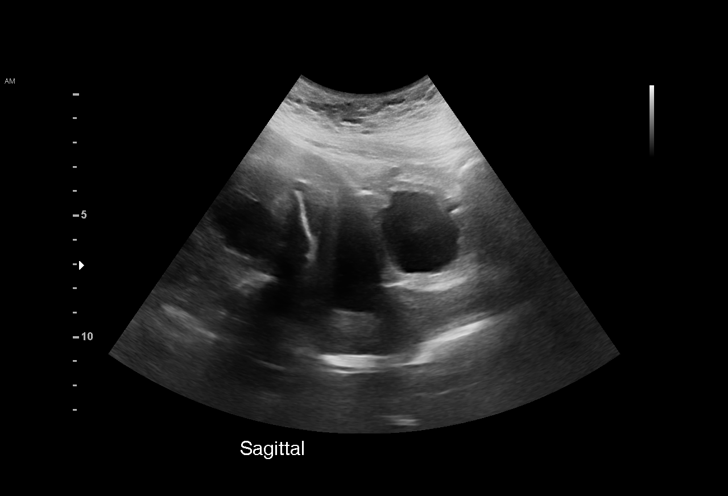
[im 6/52]
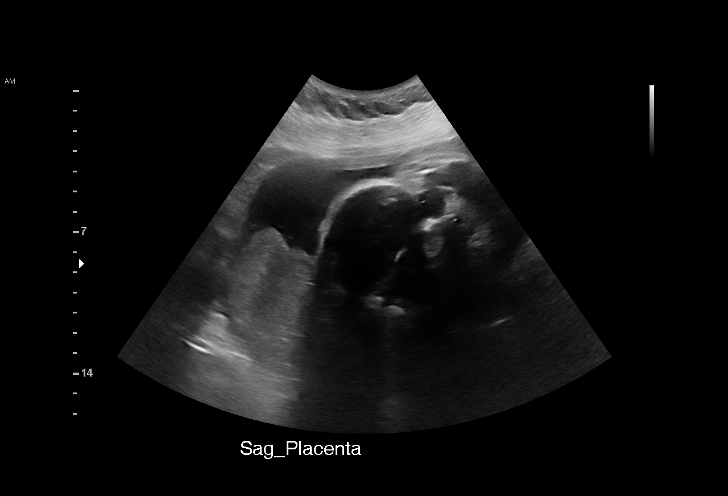
[im 10/52]
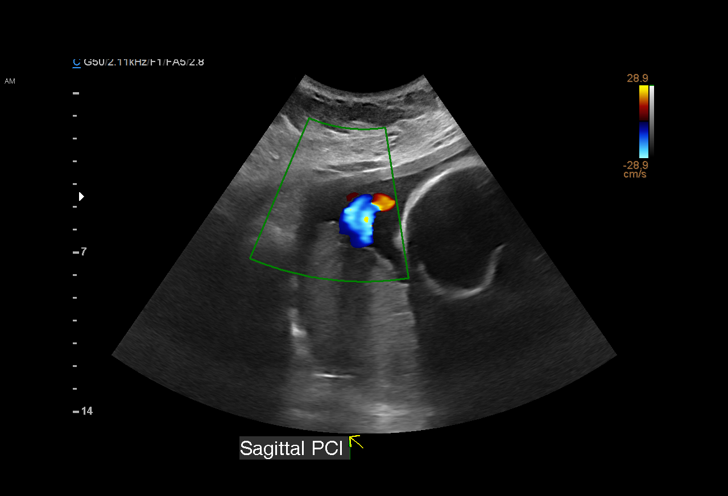
[im 16/52]
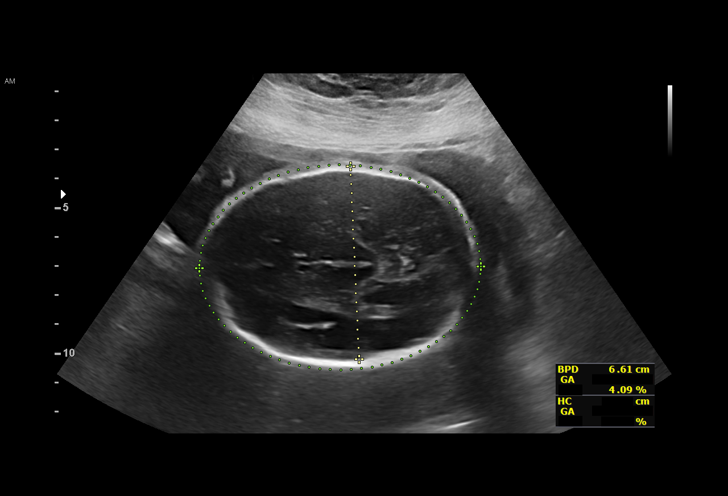
[im 19/52]
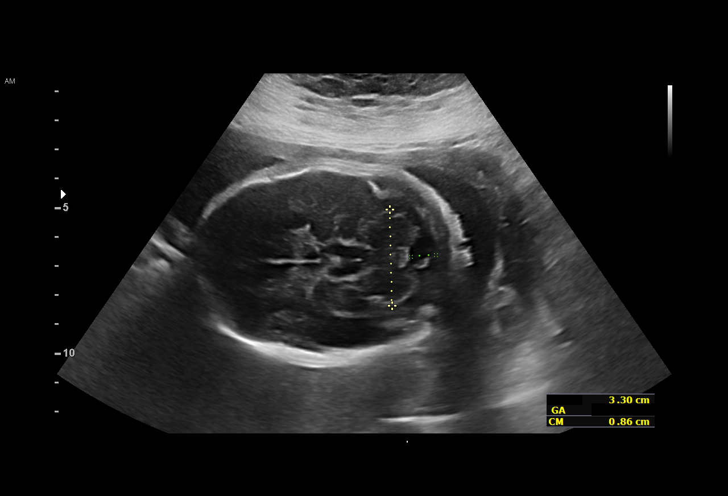
[im 23/52]
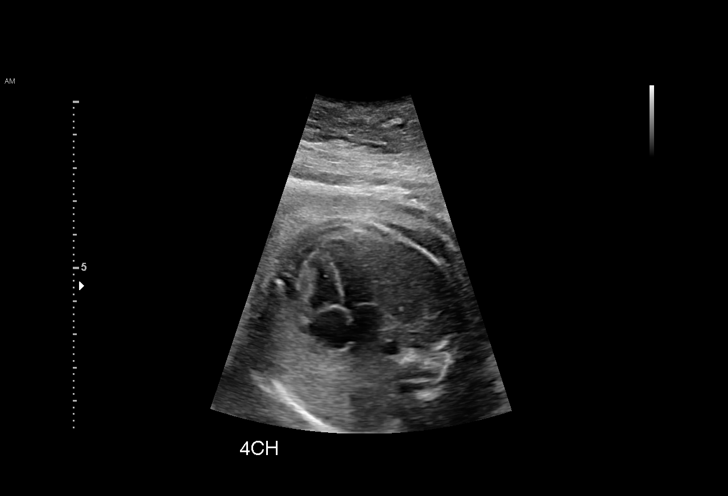
[im 29/52]
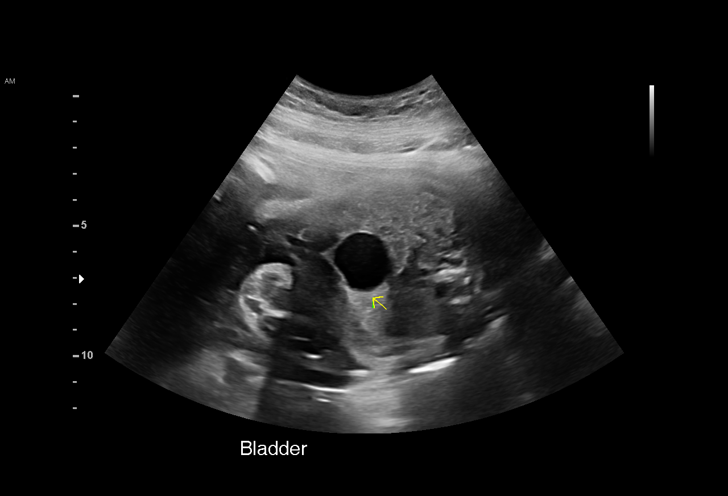
[im 33/52]
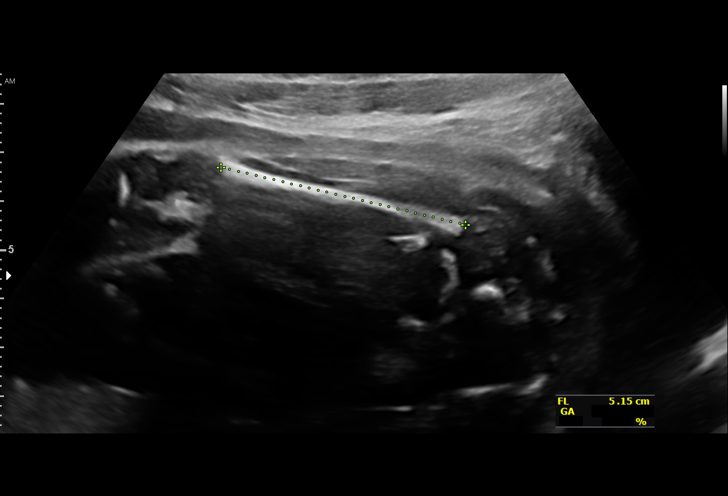
[im 36/52]
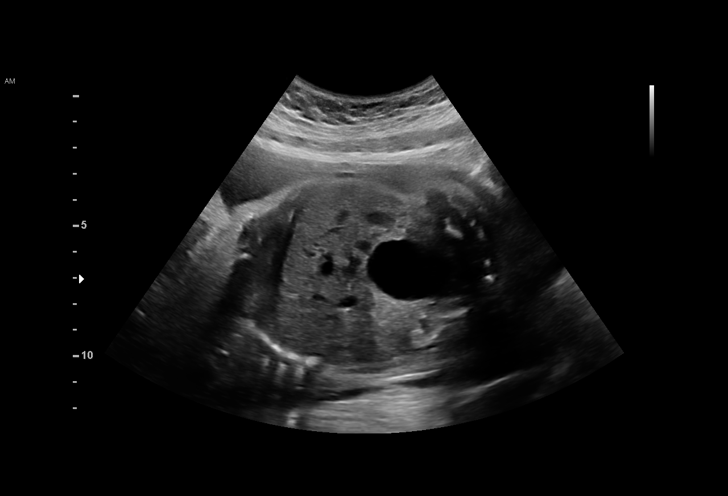
[im 42/52]
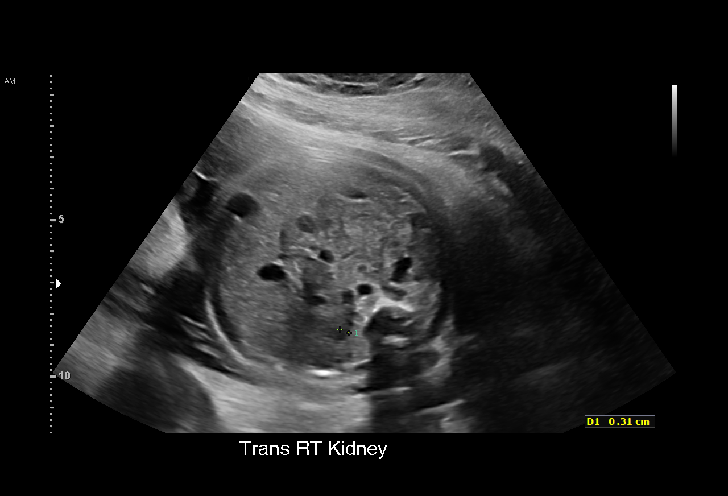
[im 46/52]
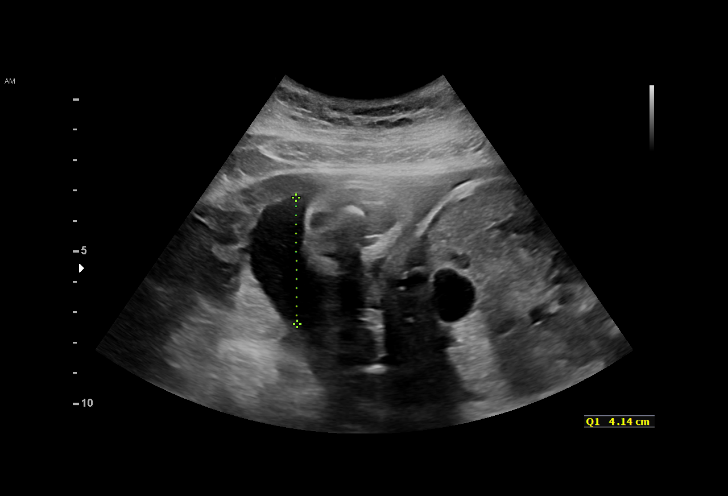
[im 50/52]
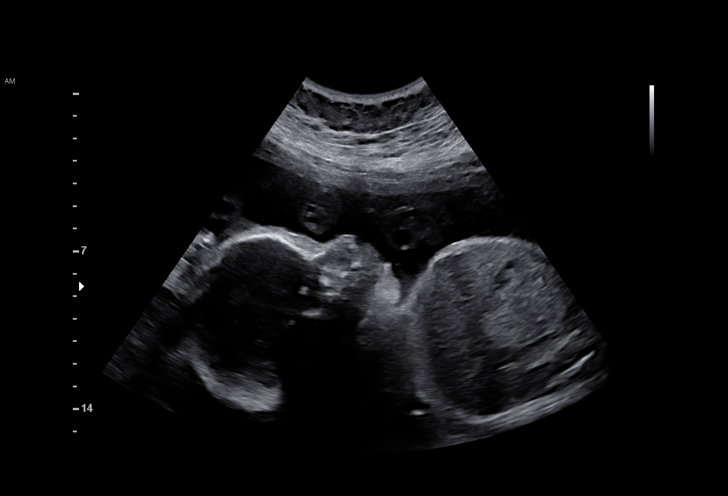

[12 of 28 positions shown; findings below may reference images not displayed]

Attending:        Zeinab Tiger        Secondary Phy.:    DAIRI ANTRI
                   [id number]

Indications

 Gestational diabetes in pregnancy,
 unspecified control
 Marginal insertion of umbilical cord affecting
 management of mother in third trimester
 Obesity complicating pregnancy, third
 trimester
 28 weeks gestation of pregnancy
 LR NIPS/ Negative AFP
 Late prenatal care, second trimester
 Genetic carrier (increased carrier risk for
 SMA)
Fetal Evaluation

 Num Of Fetuses:          1
 Fetal Heart Rate(bpm):   152
 Cardiac Activity:        Observed
 Presentation:            Cephalic
 Placenta:                Posterior
 P. Cord Insertion:       Marginal insertion
 Amniotic Fluid
 AFI FV:      Within normal limits

 AFI Sum(cm)     %Tile       Largest Pocket(cm)
 12.41           31

 RUQ(cm)       RLQ(cm)       LUQ(cm)        LLQ(cm)
 4.14          0
Biometry

 BPD:      65.9  mm     G. Age:  26w 4d        3.4  %    CI:        64.65   %    70 - 86
                                                         FL/HC:       19.7  %    18.8 -
 HC:      263.5  mm     G. Age:  28w 5d         30  %    HC/AC:       1.01       1.05 -
 AC:      261.5  mm     G. Age:  30w 2d         92  %    FL/BPD:      78.8  %    71 - 87
 FL:       51.9  mm     G. Age:  27w 5d         20  %    FL/AC:       19.8  %    20 - 24
 CER:        33  mm     G. Age:  28w 1d         45  %
 LV:        4.1  mm
 CM:        8.6  mm

 Est. FW:    4066   gm   2 lb 15 oz      66  %
OB History

 Blood Type:   O+
 Gravidity:    1
Gestational Age

 LMP:           28w 2d        Date:  08/27/21                  EDD:   06/03/22
 U/S Today:     28w 2d                                        EDD:   06/03/22
 Best:          28w 2d     Det. By:  LMP  (08/27/21)          EDD:   06/03/22
Anatomy

 Cranium:               Appears normal         Aortic Arch:            Previously seen
 Cavum:                 Appears normal         Ductal Arch:            Previously seen
 Ventricles:            Appears normal         Diaphragm:              Appears normal
 Choroid Plexus:        Previously seen        Stomach:                Appears normal, left
                                                                       sided
 Cerebellum:            Appears normal         Abdomen:                Appears normal
 Posterior Fossa:       Appears normal         Abdominal Wall:         Previously seen
 Nuchal Fold:           Not applicable (>20    Cord Vessels:           Appears normal (3
                        wks GA)                                        vessel cord)
 Face:                  Orbits and profile     Kidneys:                Appear normal
                        previously seen
 Lips:                  Appears normal         Bladder:                Appears normal
 Thoracic:              Appears normal         Spine:                  Previously seen
 Heart:                 Appears normal         Upper Extremities:      Previously seen
                        (4CH, axis, and
                        situs)
 RVOT:                  Previously seen        Lower Extremities:      Previously seen
 LVOT:                  Previously seen

 Other:  VC, 3VV and 3VTV previously visualized. Heels/feet and open
         hands/5th digits Previously visualized. Fetus previously appeared to
         be male.
Cervix Uterus Adnexa
 Cervix
 Not visualized (advanced GA >71wks)

 Uterus
 No abnormality visualized.

 Right Ovary
 Not visualized.

 Left Ovary
 Not visualized.
Impression
 Fetal growth is appropriate for gestational age.  Amniotic fluid
 is normal and good fetal activity seen.
 We reassured the patient of the findings.
 xxxxxxxxxxxxxxxxxxxxxxxxxxxxxxxxxxxxxxxxxxxxxxxxxxxxxxxx
 Consultation (see [REDACTED] )

 I [facility] the [facility] She is G1 P0 at 28-weeks' gestation
 and is here for fetal growth assessment.

 Patient has a recent diagnosis of gestational diabetes.
 Patient will be meeting with her diabetic educator next week.
 She has just started checking her blood glucose in the fasting
 level is 97 mg/DL and postprandial 152 mg/DL.

 Marginal cord insertion was seen at previous ultrasound.
 Gestational diabetes
 I explained the diagnosis of gestational diabetes.  I
 emphasized the importance of good blood glucose control to
 prevent adverse fetal or neonatal outcomes.  I discussed
 blood glucose normal values. I encouraged her to check her
 blood glucose regularly.
 Possible complications of gestational diabetes include fetal
 macrosomia, shoulder dystocia and birth injuries, stillbirth (in
 poorly controlled diabetes) and neonatal respiratory
 syndrome and other complications.

 In about 85% of cases, gestational diabetes is well controlled
 by diet alone.  Exercise reduces the need for insulin.  Medical
 treatment includes oral hypoglycemics or insulin.

 Timing of delivery: In well-controlled diabetes on diet, patient
 can be delivered at 39- or 40-weeks' gestation. Vaginal
 delivery is not contraindicated.
 Type 2 diabetes develops in up to 50% of women with GDM. I
 recommend postpartum screening with 75-g glucose load at
 6 to 12 weeks after delivery.

Recommendations

 -An appointment was made for her to return in 4 weeks for
 fetal growth assessment.
 -Weekly BPP from 32 weeks gestation if patient requires
 metformin or insulin.
 -Delivery at 39 weeks gestation provider diabetes is well
 controlled.
                Sueiro, Seyla

## 2024-08-07 ENCOUNTER — Emergency Department (HOSPITAL_COMMUNITY)

## 2024-08-07 ENCOUNTER — Encounter (HOSPITAL_COMMUNITY)
Admission: EM | Disposition: A | Discharge status: Home / Self Care | Payer: Self-pay | Source: Home / Self Care | Attending: Emergency Medicine

## 2024-08-07 ENCOUNTER — Ambulatory Visit (HOSPITAL_COMMUNITY)
Admission: EM | Admit: 2024-08-07 | Discharge: 2024-08-07 | Disposition: A | Discharge status: Home / Self Care | Attending: General Surgery | Admitting: General Surgery

## 2024-08-07 ENCOUNTER — Encounter (HOSPITAL_COMMUNITY): Payer: Self-pay

## 2024-08-07 ENCOUNTER — Other Ambulatory Visit: Payer: Self-pay

## 2024-08-07 DIAGNOSIS — K8012 Calculus of gallbladder with acute and chronic cholecystitis without obstruction: Secondary | ICD-10-CM | POA: Insufficient documentation

## 2024-08-07 DIAGNOSIS — K819 Cholecystitis, unspecified: Secondary | ICD-10-CM

## 2024-08-07 DIAGNOSIS — K8000 Calculus of gallbladder with acute cholecystitis without obstruction: Secondary | ICD-10-CM | POA: Diagnosis not present

## 2024-08-07 HISTORY — DX: Gastritis, unspecified, without bleeding: K29.70

## 2024-08-07 HISTORY — PX: CHOLECYSTECTOMY: SHX55

## 2024-08-07 LAB — CBC
HCT: 43.7 % (ref 36.0–46.0)
Hemoglobin: 13.4 g/dL (ref 12.0–15.0)
MCH: 25 pg — ABNORMAL LOW (ref 26.0–34.0)
MCHC: 30.7 g/dL (ref 30.0–36.0)
MCV: 81.5 fL (ref 80.0–100.0)
Platelets: 382 K/uL (ref 150–400)
RBC: 5.36 MIL/uL — ABNORMAL HIGH (ref 3.87–5.11)
RDW: 13.8 % (ref 11.5–15.5)
WBC: 14.4 K/uL — ABNORMAL HIGH (ref 4.0–10.5)
nRBC: 0 % (ref 0.0–0.2)

## 2024-08-07 LAB — URINALYSIS, ROUTINE W REFLEX MICROSCOPIC
Bilirubin Urine: NEGATIVE
Glucose, UA: NEGATIVE mg/dL
Ketones, ur: NEGATIVE mg/dL
Leukocytes,Ua: NEGATIVE
Nitrite: NEGATIVE
Protein, ur: NEGATIVE mg/dL
Specific Gravity, Urine: 1.006 (ref 1.005–1.030)
pH: 7 (ref 5.0–8.0)

## 2024-08-07 LAB — COMPREHENSIVE METABOLIC PANEL WITH GFR
ALT: 30 U/L (ref 0–44)
AST: 23 U/L (ref 15–41)
Albumin: 4.1 g/dL (ref 3.5–5.0)
Alkaline Phosphatase: 79 U/L (ref 38–126)
Anion gap: 13 (ref 5–15)
BUN: 9 mg/dL (ref 6–20)
CO2: 20 mmol/L — ABNORMAL LOW (ref 22–32)
Calcium: 9 mg/dL (ref 8.9–10.3)
Chloride: 105 mmol/L (ref 98–111)
Creatinine, Ser: 0.5 mg/dL (ref 0.44–1.00)
GFR, Estimated: >60 mL/min (ref >60)
Glucose, Bld: 134 mg/dL — ABNORMAL HIGH (ref 70–99)
Potassium: 3.3 mmol/L — ABNORMAL LOW (ref 3.5–5.1)
Sodium: 138 mmol/L (ref 135–145)
Total Bilirubin: 0.6 mg/dL (ref 0.0–1.2)
Total Protein: 7.5 g/dL (ref 6.5–8.1)

## 2024-08-07 LAB — HCG, SERUM, QUALITATIVE: Preg, Serum: NEGATIVE

## 2024-08-07 LAB — LIPASE, BLOOD: Lipase: 31 U/L (ref 11–51)

## 2024-08-07 SURGERY — LAPAROSCOPIC CHOLECYSTECTOMY WITH INTRAOPERATIVE CHOLANGIOGRAM
Anesthesia: General | Site: Abdomen

## 2024-08-07 MED ORDER — OXYCODONE HCL 5 MG/5ML PO SOLN: 5.0000 mg | Freq: Once | ORAL | Status: AC | PRN

## 2024-08-07 MED ORDER — CHLORHEXIDINE GLUCONATE 0.12 % MT SOLN
15.0000 mL | Freq: Once | OROMUCOSAL | Status: AC
  Administered 2024-08-07: 15 mL via OROMUCOSAL

## 2024-08-07 MED ORDER — SODIUM CHLORIDE 0.9 % IR SOLN
Status: DC | PRN
  Administered 2024-08-07: 1000 mL

## 2024-08-07 MED ORDER — KETOROLAC TROMETHAMINE 30 MG/ML IJ SOLN
INTRAMUSCULAR | Status: AC
  Filled 2024-08-07: qty 1

## 2024-08-07 MED ORDER — ROCURONIUM BROMIDE 10 MG/ML (PF) SYRINGE
PREFILLED_SYRINGE | INTRAVENOUS | Status: AC
  Filled 2024-08-07: qty 10

## 2024-08-07 MED ORDER — PROPOFOL 10 MG/ML IV BOLUS
INTRAVENOUS | Status: AC
  Filled 2024-08-07: qty 20

## 2024-08-07 MED ORDER — MIDAZOLAM HCL 2 MG/2ML IJ SOLN
INTRAMUSCULAR | Status: AC
  Filled 2024-08-07: qty 2

## 2024-08-07 MED ORDER — ROCURONIUM BROMIDE 100 MG/10ML IV SOLN
INTRAVENOUS | Status: DC | PRN
  Administered 2024-08-07: 60 mg via INTRAVENOUS
  Administered 2024-08-07: 10 mg via INTRAVENOUS

## 2024-08-07 MED ORDER — INDOCYANINE GREEN 25 MG IV SOLR
1.2500 mg | Freq: Once | INTRAVENOUS | Status: AC
  Administered 2024-08-07: 1.25 mg via INTRAVENOUS
  Filled 2024-08-07: qty 10

## 2024-08-07 MED ORDER — ONDANSETRON HCL 4 MG/2ML IJ SOLN
4.0000 mg | Freq: Once | INTRAMUSCULAR | Status: AC
  Administered 2024-08-07: 4 mg via INTRAVENOUS
  Filled 2024-08-07: qty 2

## 2024-08-07 MED ORDER — OXYCODONE HCL 5 MG PO TABS
5.0000 mg | ORAL_TABLET | Freq: Once | ORAL | Status: AC | PRN
  Administered 2024-08-07: 5 mg via ORAL

## 2024-08-07 MED ORDER — SUGAMMADEX SODIUM 200 MG/2ML IV SOLN
INTRAVENOUS | Status: AC
  Filled 2024-08-07: qty 2

## 2024-08-07 MED ORDER — IOHEXOL 300 MG/ML  SOLN
INTRAMUSCULAR | Status: DC | PRN
Start: 2024-08-07 — End: 2024-08-07
  Administered 2024-08-07: 50 mL

## 2024-08-07 MED ORDER — OXYCODONE HCL 5 MG PO TABS: 5.0000 mg | ORAL_TABLET | Freq: Four times a day (QID) | ORAL | 0 refills | Status: AC | PRN

## 2024-08-07 MED ORDER — KETOROLAC TROMETHAMINE 30 MG/ML IJ SOLN
INTRAMUSCULAR | Status: DC | PRN
  Administered 2024-08-07: 30 mg via INTRAVENOUS

## 2024-08-07 MED ORDER — BUPIVACAINE-EPINEPHRINE 0.25% -1:200000 IJ SOLN
INTRAMUSCULAR | Status: DC | PRN
  Administered 2024-08-07: 30 mL

## 2024-08-07 MED ORDER — PHENYLEPHRINE 80 MCG/ML (10ML) SYRINGE FOR IV PUSH (FOR BLOOD PRESSURE SUPPORT)
PREFILLED_SYRINGE | INTRAVENOUS | Status: DC | PRN
  Administered 2024-08-07: 80 ug via INTRAVENOUS

## 2024-08-07 MED ORDER — LACTATED RINGERS IR SOLN
Status: DC | PRN
  Administered 2024-08-07: 1000 mL

## 2024-08-07 MED ORDER — FENTANYL CITRATE (PF) 100 MCG/2ML IJ SOLN
INTRAMUSCULAR | Status: AC
  Filled 2024-08-07: qty 2

## 2024-08-07 MED ORDER — ONDANSETRON HCL 4 MG/2ML IJ SOLN
INTRAMUSCULAR | Status: DC | PRN
  Administered 2024-08-07: 4 mg via INTRAVENOUS

## 2024-08-07 MED ORDER — SODIUM CHLORIDE 0.9 % IV SOLN
2.0000 g | Freq: Once | INTRAVENOUS | Status: AC
  Administered 2024-08-07: 2 g via INTRAVENOUS
  Filled 2024-08-07: qty 20

## 2024-08-07 MED ORDER — HYDROMORPHONE HCL 1 MG/ML IJ SOLN
1.0000 mg | Freq: Once | INTRAMUSCULAR | Status: AC
  Administered 2024-08-07: 1 mg via INTRAVENOUS
  Filled 2024-08-07: qty 1

## 2024-08-07 MED ORDER — LACTATED RINGERS IV SOLN: INTRAVENOUS | Status: DC

## 2024-08-07 MED ORDER — MORPHINE SULFATE (PF) 4 MG/ML IV SOLN
4.0000 mg | Freq: Once | INTRAVENOUS | Status: AC
  Administered 2024-08-07: 4 mg via INTRAVENOUS
  Filled 2024-08-07: qty 1

## 2024-08-07 MED ORDER — DROPERIDOL 2.5 MG/ML IJ SOLN: 0.6250 mg | Freq: Once | INTRAMUSCULAR | Status: DC | PRN

## 2024-08-07 MED ORDER — ACETAMINOPHEN 10 MG/ML IV SOLN
1000.0000 mg | Freq: Once | INTRAVENOUS | Status: DC | PRN
  Administered 2024-08-07: 1000 mg via INTRAVENOUS

## 2024-08-07 MED ORDER — FENTANYL CITRATE PF 50 MCG/ML IJ SOSY
PREFILLED_SYRINGE | INTRAMUSCULAR | Status: AC
  Filled 2024-08-07: qty 1

## 2024-08-07 MED ORDER — MIDAZOLAM HCL 5 MG/5ML IJ SOLN
INTRAMUSCULAR | Status: DC | PRN
  Administered 2024-08-07: 2 mg via INTRAVENOUS

## 2024-08-07 MED ORDER — SUGAMMADEX SODIUM 200 MG/2ML IV SOLN
INTRAVENOUS | Status: DC | PRN
  Administered 2024-08-07: 2 mg via INTRAVENOUS

## 2024-08-07 MED ORDER — LIDOCAINE HCL (CARDIAC) PF 100 MG/5ML IV SOSY
PREFILLED_SYRINGE | INTRAVENOUS | Status: DC | PRN
  Administered 2024-08-07: 100 mg via INTRAVENOUS

## 2024-08-07 MED ORDER — ORAL CARE MOUTH RINSE: 15.0000 mL | Freq: Once | OROMUCOSAL | Status: AC

## 2024-08-07 MED ORDER — LIDOCAINE HCL (PF) 2 % IJ SOLN
INTRAMUSCULAR | Status: AC
  Filled 2024-08-07: qty 5

## 2024-08-07 MED ORDER — ACETAMINOPHEN 10 MG/ML IV SOLN
INTRAVENOUS | Status: AC
  Filled 2024-08-07: qty 100

## 2024-08-07 MED ORDER — DEXAMETHASONE SODIUM PHOSPHATE 10 MG/ML IJ SOLN
INTRAMUSCULAR | Status: DC | PRN
  Administered 2024-08-07: 10 mg via INTRAVENOUS

## 2024-08-07 MED ORDER — PROPOFOL 10 MG/ML IV BOLUS
INTRAVENOUS | Status: DC | PRN
  Administered 2024-08-07: 200 mg via INTRAVENOUS

## 2024-08-07 MED ORDER — FENTANYL CITRATE PF 50 MCG/ML IJ SOSY
25.0000 ug | PREFILLED_SYRINGE | INTRAMUSCULAR | Status: DC | PRN
  Administered 2024-08-07 (×2): 50 ug via INTRAVENOUS

## 2024-08-07 MED ORDER — SODIUM CHLORIDE 0.9 % IV BOLUS
1000.0000 mL | Freq: Once | INTRAVENOUS | Status: AC
  Administered 2024-08-07: 1000 mL via INTRAVENOUS

## 2024-08-07 MED ORDER — ONDANSETRON HCL 4 MG/2ML IJ SOLN
INTRAMUSCULAR | Status: AC
  Filled 2024-08-07: qty 2

## 2024-08-07 MED ORDER — METRONIDAZOLE 500 MG/100ML IV SOLN
500.0000 mg | Freq: Once | INTRAVENOUS | Status: AC
  Administered 2024-08-07: 500 mg via INTRAVENOUS
  Filled 2024-08-07: qty 100

## 2024-08-07 MED ORDER — ACETAMINOPHEN 500 MG PO TABS: 1000.0000 mg | ORAL_TABLET | Freq: Four times a day (QID) | ORAL | Status: AC | PRN

## 2024-08-07 MED ORDER — ACETAMINOPHEN 500 MG PO TABS: 1000.0000 mg | ORAL_TABLET | ORAL | Status: DC

## 2024-08-07 MED ORDER — OXYCODONE HCL 5 MG PO TABS
ORAL_TABLET | ORAL | Status: AC
  Filled 2024-08-07: qty 1

## 2024-08-07 MED ORDER — FENTANYL CITRATE (PF) 100 MCG/2ML IJ SOLN
INTRAMUSCULAR | Status: DC | PRN
  Administered 2024-08-07: 50 ug via INTRAVENOUS
  Administered 2024-08-07: 100 ug via INTRAVENOUS

## 2024-08-07 MED ORDER — BUPIVACAINE-EPINEPHRINE (PF) 0.25% -1:200000 IJ SOLN
INTRAMUSCULAR | Status: AC
  Filled 2024-08-07: qty 30

## 2024-08-07 MED ORDER — STERILE WATER FOR IRRIGATION IR SOLN
Status: DC | PRN
Start: 2024-08-07 — End: 2024-08-07
  Administered 2024-08-07: 1000 mL

## 2024-08-07 SURGICAL SUPPLY — 42 items
APPLICATOR ARISTA FLEXITIP XL (MISCELLANEOUS) IMPLANT
BAG COUNTER SPONGE SURGICOUNT (BAG) IMPLANT
CABLE HIGH FREQUENCY MONO STRZ (ELECTRODE) ×1 IMPLANT
CHLORAPREP W/TINT 26 (MISCELLANEOUS) ×1 IMPLANT
CLIP APPLIE 5 13 M/L LIGAMAX5 (MISCELLANEOUS) IMPLANT
CLIP APPLIE ROT 10 11.4 M/L (STAPLE) IMPLANT
CLIP LIGATING HEMO O LOK GREEN (MISCELLANEOUS) IMPLANT
COVER MAYO STAND XLG (MISCELLANEOUS) IMPLANT
COVER SURGICAL LIGHT HANDLE (MISCELLANEOUS) ×1 IMPLANT
DERMABOND ADVANCED .7 DNX12 (GAUZE/BANDAGES/DRESSINGS) IMPLANT
DRAPE C-ARM 42X120 X-RAY (DRAPES) IMPLANT
DRSG TEGADERM 2-3/8X2-3/4 SM (GAUZE/BANDAGES/DRESSINGS) ×3 IMPLANT
DRSG TEGADERM 4X4.75 (GAUZE/BANDAGES/DRESSINGS) ×1 IMPLANT
ELECT REM PT RETURN 15FT ADLT (MISCELLANEOUS) ×1 IMPLANT
GAUZE SPONGE 2X2 8PLY STRL LF (GAUZE/BANDAGES/DRESSINGS) ×1 IMPLANT
GLOVE BIO SURGEON STRL SZ7.5 (GLOVE) ×1 IMPLANT
GLOVE INDICATOR 8.0 STRL GRN (GLOVE) ×1 IMPLANT
GOWN STRL REUS W/ TWL XL LVL3 (GOWN DISPOSABLE) ×1 IMPLANT
GRASPER SUT TROCAR 14GX15 (MISCELLANEOUS) IMPLANT
HEMOSTAT ARISTA ABSORB 3G PWDR (HEMOSTASIS) IMPLANT
HEMOSTAT SNOW SURGICEL 2X4 (HEMOSTASIS) IMPLANT
IRRIGATION SUCT STRKRFLW 2 WTP (MISCELLANEOUS) ×1 IMPLANT
KIT BASIN OR (CUSTOM PROCEDURE TRAY) ×1 IMPLANT
KIT TURNOVER KIT A (KITS) ×1 IMPLANT
LHOOK LAP DISP 36CM (ELECTROSURGICAL) IMPLANT
POUCH RETRIEVAL ECOSAC 10 (ENDOMECHANICALS) ×1 IMPLANT
SCISSORS LAP 5X35 DISP (ENDOMECHANICALS) ×1 IMPLANT
SET CHOLANGIOGRAPH MIX (MISCELLANEOUS) IMPLANT
SET TUBE SMOKE EVAC HIGH FLOW (TUBING) ×1 IMPLANT
SLEEVE ADV FIXATION 5X100MM (TROCAR) ×1 IMPLANT
SPIKE FLUID TRANSFER (MISCELLANEOUS) ×1 IMPLANT
STRIP CLOSURE SKIN 1/2X4 (GAUZE/BANDAGES/DRESSINGS) ×1 IMPLANT
SUT MNCRL AB 4-0 PS2 18 (SUTURE) ×1 IMPLANT
SUT VIC AB 0 UR5 27 (SUTURE) IMPLANT
SUT VICRYL 0 TIES 12 18 (SUTURE) IMPLANT
SUT VICRYL 0 UR6 27IN ABS (SUTURE) IMPLANT
TOWEL OR 17X26 10 PK STRL BLUE (TOWEL DISPOSABLE) ×1 IMPLANT
TRAY LAPAROSCOPIC (CUSTOM PROCEDURE TRAY) ×1 IMPLANT
TROCAR ADV FIXATION 12X100MM (TROCAR) IMPLANT
TROCAR ADV FIXATION 5X100MM (TROCAR) ×1 IMPLANT
TROCAR BALLN 12MMX100 BLUNT (TROCAR) IMPLANT
TROCAR XCEL NON-BLD 5MMX100MML (ENDOMECHANICALS) IMPLANT

## 2024-08-07 NOTE — Op Note (Signed)
 Kiara Webster Kiara Webster [id number] 10/18/1991 08/07/2024  Laparoscopic Cholecystectomy with near infrared fluorescent cholangiography and intraoperative fluoroscopic cholangiogram procedure Note  Indications: This patient presents with symptomatic gallbladder disease and will undergo laparoscopic cholecystectomy.  Pre-operative Diagnosis: Acute calculus cholecystitis  Post-operative Diagnosis: Same  Surgeon: Kiara Blush MD FACS  Assistants: None  Anesthesia: General endotracheal anesthesia  Procedure Details  The patient was seen again in the Holding Room. The risks, benefits, complications, treatment options, and expected outcomes were discussed with the patient. The possibilities of reaction to medication, pulmonary aspiration, perforation of viscus, bleeding, recurrent infection, finding a normal gallbladder, the need for additional procedures, failure to diagnose a condition, the possible need to convert to an open procedure, and creating a complication requiring transfusion or operation were discussed with the patient. The likelihood of improving the patient's symptoms with return to their baseline status is good.  The patient and/or family concurred with the proposed plan, giving informed consent. The site of surgery properly noted. The patient was taken to Operating Room, identified as Kiara Webster and the procedure verified as Laparoscopic Cholecystectomy with Intraoperative Cholangiogram. A Time Out was held and the above information confirmed. Antibiotic prophylaxis was administered. The patient was administered green dye preop.   Prior to the induction of general anesthesia, antibiotic prophylaxis was administered. General endotracheal anesthesia was then administered and tolerated well. After the induction, the abdomen was prepped with Chloraprep and draped in the sterile fashion. The patient was positioned in the supine position.  Local anesthetic agent was  injected into the skin near the umbilicus and an incision made. We dissected down to the abdominal fascia with blunt dissection.  The fascia was incised vertically and we entered the peritoneal cavity bluntly.  A pursestring suture of 0-Vicryl was placed around the fascial opening.  The Hasson cannula was inserted and secured with the stay suture.  Pneumoperitoneum was then created with CO2 and tolerated well without any adverse changes in the patient's vital signs. An 5-mm port was placed in the subxiphoid position.  Two 5-mm ports were placed in the right upper quadrant. All skin incisions were infiltrated with a local anesthetic agent before making the incision and placing the trocars.   We positioned the patient in reverse Trendelenburg, tilted slightly to the patient's left.  The gallbladder was identified.  It was distended and had edema in the gallbladder wall.  In order to facilitate retraction I aspirated the gallbladder.  The fundus was grasped and retracted cephalad. Adhesions were lysed bluntly and with the electrocautery where indicated, taking care not to injure any adjacent organs or viscus. The infundibulum was grasped and retracted laterally, exposing the peritoneum overlying the triangle of Calot. This was then divided and exposed in a blunt fashion. A critical view of the cystic duct and cystic artery was obtained.  Patient had both a anterior and posterior cystic artery going into the gallbladder.  The cystic duct was clearly identified and bluntly dissected circumferentially.  Utilizing the Stryker camera system near infrared fluorescent activity was visualized in the liver, cystic duct, common hepatic duct and common bile duct.  This served as a secondary confirmation of our anatomy.   The cystic duct was ligated with a clip distally.   An incision was made in the cystic duct and the [facility] cholangiogram catheter introduced. The catheter was secured using a clip. A cholangiogram was then  obtained which showed good visualization of the distal and proximal biliary tree with no sign of  filling defects or obstruction.  Contrast flowed easily into the duodenum. The catheter was then removed.   The cystic duct was then ligated with clips and divided. The cystic artery (the anterior and posterior branch ) which had been identified & dissected free were ligated with clips and divided as well.   The gallbladder was dissected from the liver bed in retrograde fashion with the electrocautery. The gallbladder was removed and placed in an Ecco sac.  The gallbladder and Ecco sac were then removed through the umbilical port site. The liver bed was irrigated and inspected. Hemostasis was achieved with the electrocautery. Copious irrigation was utilized and was repeatedly aspirated until clear.  The pursestring suture was used to close the umbilical fascia.  An additional interrupted 0 Vicryl was placed at the umbilical fascia with PMI suture passer with laparoscopic guidance  We again inspected the right upper quadrant for hemostasis.  The umbilical closure was inspected and there was no air leak and nothing trapped within the closure. Pneumoperitoneum was released as we removed the trocars.  4-0 Monocryl was used to close the skin.   Dermabond was applied. The patient was then extubated and brought to the recovery room in stable condition. Instrument, sponge, and needle counts were correct at closure and at the conclusion of the case.   Findings: Cholecystitis with Cholelithiasis critical view Near infrared fluorescent cholangiography visualized activity in the liver, common hepatic duct, common bile duct, cystic duct and small bowel Normal intraoperative cholangiogram  Estimated Blood Loss: Minimal         Drains: none         Specimens: Gallbladder           Complications: None; patient tolerated the procedure well.         Disposition: PACU - hemodynamically stable.         Condition:  stable  Kiara Webster. Tanda, MD, FACS General, Bariatric, & Minimally Invasive Surgery [facility],  [place]

## 2024-08-07 NOTE — Anesthesia Preprocedure Evaluation (Addendum)
 Anesthesia Evaluation  Patient identified by MRN, date of birth, ID band Patient awake    Reviewed: Allergy & Precautions, H&P , NPO status , Patient's Chart, lab work & pertinent test results  Airway Mallampati: II  TM Distance: >3 FB Neck ROM: Full    Dental no notable dental hx.    Pulmonary neg pulmonary ROS   Pulmonary exam normal breath sounds clear to auscultation       Cardiovascular negative cardio ROS Normal cardiovascular exam Rhythm:Regular Rate:Normal     Neuro/Psych negative neurological ROS  negative psych ROS   GI/Hepatic Neg liver ROS,,,Cholecystitis    Endo/Other    Renal/GU negative Renal ROS  negative genitourinary   Musculoskeletal negative musculoskeletal ROS (+)    Abdominal   Peds negative pediatric ROS (+)  Hematology negative hematology ROS (+)   Anesthesia Other Findings   Reproductive/Obstetrics negative OB ROS                              Anesthesia Physical Anesthesia Plan  ASA: 2  Anesthesia Plan: General   Post-op Pain Management: Tylenol  PO (pre-op)*   Induction: Intravenous  PONV Risk Score and Plan: 3 and Ondansetron   Airway Management Planned: Oral ETT  Additional Equipment: None  Intra-op Plan:   Post-operative Plan: Extubation in OR  Informed Consent: I have reviewed the patients History and Physical, chart, labs and discussed the procedure including the risks, benefits and alternatives for the proposed anesthesia with the patient or authorized representative who has indicated his/her understanding and acceptance.     Dental advisory given  Plan Discussed with: CRNA  Anesthesia Plan Comments:          Anesthesia Quick Evaluation

## 2024-08-07 NOTE — ED Triage Notes (Signed)
 Pt reports RUQ abdominal pain coming on gradually at 9pm radiating through to back. Progressively worsening since with associated vomiting. Denies diarrhea, fever. Pt HTN and tachycardic in triage.

## 2024-08-07 NOTE — Anesthesia Postprocedure Evaluation (Signed)
 Anesthesia Post Note  Patient: Kiara Webster  Procedure(s) Performed: LAPAROSCOPIC CHOLECYSTECTOMY WITH INTRAOPERATIVE CHOLANGIOGRAM (Abdomen)     Patient location during evaluation: PACU Anesthesia Type: General Level of consciousness: awake and alert Pain management: pain level controlled Vital Signs Assessment: post-procedure vital signs reviewed and stable Respiratory status: spontaneous breathing, nonlabored ventilation, respiratory function stable and patient connected to nasal cannula oxygen Cardiovascular status: blood pressure returned to baseline and stable Postop Assessment: no apparent nausea or vomiting Anesthetic complications: no   No notable events documented.  Last Vitals:  Vitals:   08/07/24 1300 08/07/24 1309  BP: (!) 150/102 (!) 134/100  Pulse: 89 95  Resp: 20 (!) 21  Temp:    SpO2: 93% 94%    Last Pain:  Vitals:   08/07/24 1300  TempSrc:   PainSc: 4                  Thom JONELLE Peoples

## 2024-08-07 NOTE — ED Provider Notes (Signed)
 [facility] Emergency Department Provider Note MRN:  [id number]  Arrival date & time: 08/07/24     Chief Complaint   Abdominal Pain and Vomiting   History of Present Illness   Kiara Webster is a 33 y.o. year-old female with no pertinent past medical history presenting to the ED with chief complaint of abdominal pain and vomiting.  Right upper quadrant abdominal pain since 6 PM.  Became much worse at 10 PM associated with nausea vomiting.  Denies fever.  Review of Systems  A thorough review of systems was obtained and all systems are negative except as noted in the HPI and PMH.   Patient's Health History    Past Medical History:  Diagnosis Date   Gestational diabetes    Medical history non-contributory    Vaginal delivery 05/28/2022    Past Surgical History:  Procedure Laterality Date   NO PAST SURGERIES      Family History  Problem Relation Age of Onset   Cancer Neg Hx    Hypertension Neg Hx     Social History   Socioeconomic History   Marital status: Single    Spouse name: Not on file   Number of children: Not on file   Years of education: Not on file   Highest education level: Not on file  Occupational History   Not on file  Tobacco Use   Smoking status: Never   Smokeless tobacco: Never  Vaping Use   Vaping status: Never Used  Substance and Sexual Activity   Alcohol use: Not Currently   Drug use: Not Currently   Sexual activity: Yes  Other Topics Concern   Not on file  Social History Narrative   Not on file   Social Drivers of Health   Financial Resource Strain: Not on file  Food Insecurity: Not on file  Transportation Needs: Not on file  Physical Activity: Not on file  Stress: Not on file  Social Connections: Not on file  Intimate Partner Violence: Not on file     Physical Exam   Vitals:   08/07/24 0318  BP: (!) 169/107  Pulse: (!) 118  Resp: 18  Temp: 98.7 F (37.1 C)  SpO2: 99%     CONSTITUTIONAL: Well-appearing, NAD NEURO/PSYCH:  Alert and oriented x 3, no focal deficits EYES:  eyes equal and reactive ENT/NECK:  no LAD, no JVD CARDIO: Tachycardic rate, well-perfused, normal S1 and S2 PULM:  CTAB no wheezing or rhonchi GI/GU:  non-distended, mild right upper quadrant tenderness to palpation MSK/SPINE:  No gross deformities, no edema SKIN:  no rash, atraumatic   *Additional and/or pertinent findings included in MDM below  Diagnostic and Interventional Summary    EKG Interpretation Date/Time:    Ventricular Rate:    PR Interval:    QRS Duration:    QT Interval:    QTC Calculation:   R Axis:      Text Interpretation:         Labs Reviewed  COMPREHENSIVE METABOLIC PANEL WITH GFR - Abnormal; Notable for the following components:      Result Value   Potassium 3.3 (*)    CO2 20 (*)    Glucose, Bld 134 (*)    All other components within normal limits  CBC - Abnormal; Notable for the following components:   WBC 14.4 (*)    RBC 5.36 (*)    MCH 25.0 (*)    All other components within normal limits  URINALYSIS, ROUTINE  W REFLEX MICROSCOPIC - Abnormal; Notable for the following components:   Color, Urine STRAW (*)    Hgb urine dipstick MODERATE (*)    Bacteria, UA RARE (*)    All other components within normal limits  LIPASE, BLOOD  HCG, SERUM, QUALITATIVE    US  Abdomen Limited RUQ (LIVER/GB)  Final Result      Medications  cefTRIAXone  (ROCEPHIN ) 2 g in sodium chloride  0.9 % 100 mL IVPB (2 g Intravenous New Bag/Given 08/07/24 0606)  metroNIDAZOLE  (FLAGYL ) IVPB 500 mg (has no administration in time range)  morphine  (PF) 4 MG/ML injection 4 mg (4 mg Intravenous Given 08/07/24 0410)  ondansetron  (ZOFRAN ) injection 4 mg (4 mg Intravenous Given 08/07/24 0411)  sodium chloride  0.9 % bolus 1,000 mL (0 mLs Intravenous Stopped 08/07/24 0519)  HYDROmorphone  (DILAUDID ) injection 1 mg (1 mg Intravenous Given 08/07/24 0516)     Procedures  /  Critical  Care .Critical Care  Performed by: Theadore Ozell HERO, MD Authorized by: Theadore Ozell HERO, MD   Critical care provider statement:    Critical care time (minutes):  35   Critical care was necessary to treat or prevent imminent or life-threatening deterioration of the following conditions: Acute cholecystitis.   Critical care was time spent personally by me on the following activities:  Development of treatment plan with patient or surrogate, discussions with consultants, evaluation of patient's response to treatment, examination of patient, ordering and review of laboratory studies, ordering and review of radiographic studies, ordering and performing treatments and interventions, pulse oximetry, re-evaluation of patient's condition and review of old charts   ED Course and Medical Decision Making  Initial Impression and Ddx Presentation is suspicious for biliary colic versus cholecystitis.  Other considerations include pancreatitis, gastritis, peptic ulcer disease, kidney stone  Past medical/surgical history that increases complexity of ED encounter: None  Interpretation of Diagnostics I personally reviewed the Laboratory Testing and my interpretation is as follows: No significant blood count or electrolyte disturbance.  Ultrasound suspicious for cholecystitis  Patient Reassessment and Ultimate Disposition/Management     Starting antibiotics, pain is better controlled, will reach out to surgery for admission.  Patient management required discussion with the following services or consulting groups:  General/Trauma Surgery  Complexity of Problems Addressed Acute illness or injury that poses threat of life of bodily function  Additional Data Reviewed and Analyzed Further history obtained from: Further history from spouse/family member  Additional Factors Impacting ED Encounter Risk Consideration of hospitalization  Ozell HERO. Theadore, MD [facility] Emergency Medicine [facility]  Health [email or web address]  Final Clinical Impressions(s) / ED Diagnoses     ICD-10-CM   1. Cholecystitis  K81.9       ED Discharge Orders     None        Discharge Instructions Discussed with and Provided to Patient:   Discharge Instructions   None      Theadore Ozell HERO, MD 08/07/24 [phone]

## 2024-08-07 NOTE — Transfer of Care (Signed)
 Immediate Anesthesia Transfer of Care Note  Patient: Kiara Webster  Procedure(s) Performed: LAPAROSCOPIC CHOLECYSTECTOMY WITH INTRAOPERATIVE CHOLANGIOGRAM (Abdomen)  Patient Location: PACU  Anesthesia Type:General  Level of Consciousness: awake and drowsy  Airway & Oxygen Therapy: Patient Spontanous Breathing  Post-op Assessment: Report given to RN and Post -op Vital signs reviewed and stable  Post vital signs: Reviewed and stable  Last Vitals:  Vitals Value Taken Time  BP 153/98 08/07/24 11:12  Temp    Pulse 87 08/07/24 11:13  Resp 21 08/07/24 11:13  SpO2 100 % 08/07/24 11:13  Vitals shown include unfiled device data.  Last Pain:  Vitals:   08/07/24 0921  TempSrc: Oral  PainSc: 5       Patients Stated Pain Goal: 5 (08/07/24 0921)  Complications: No notable events documented.

## 2024-08-07 NOTE — H&P (Signed)
 Kiara Webster [place] [birth year]  [id number].    Chief Complaint/Reason for Consult: RUQ abdominal pain  HPI:  This is a pleasant, Spanish-speaking 33 yo female with no significant PMH who had some RUQ abdominal pain last week, but improved with Tylenol .  She then had this same pain, but significantly worse, last night after supper.  She had N/V, but no diarrhea or fevers.  She states her pain radiated to her back, but denies CP, SOB.  She presented to the ED where she has a WBC of 14K, but LFTs and lipase normal.  Her US  shows cholelithiasis with findings worrisome for acute cholecystitis.  Her CBD is mildly prominent at 7.45mm but no intrahepatic ductal dilatation.  We have been asked to see her for further management.  Kiara Webster with [employer] was used for the duration of my conversation.  ROS: ROS: see HPI  Family History  Problem Relation Age of Onset   Cancer Neg Hx    Hypertension Neg Hx     Past Medical History:  Diagnosis Date   Gestational diabetes    Medical history non-contributory    Vaginal delivery 05/28/2022    Past Surgical History:  Procedure Laterality Date   NO PAST SURGERIES      Social History:  reports that she has never smoked. She has never used smokeless tobacco. She reports that she does not currently use alcohol. She reports that she does not currently use drugs.  Allergies: No Known Allergies  Medications Prior to Admission  Medication Sig Dispense Refill   Prenat-Fe Poly-Methfol-FA-DHA (VITAFOL  ULTRA) 29-0.6-0.4-200 MG CAPS Take 1 capsule by mouth daily before breakfast. 90 capsule 4     Physical Exam: Blood pressure 138/89, pulse 88, temperature 98.5 F (36.9 C), resp. rate 16, weight 104.3 kg, SpO2 98%, currently breastfeeding. General: pleasant, WD, WN Hispanic female who is laying in bed in NAD HEENT: head is normocephalic, atraumatic.  Sclera are noninjected.  PERRL.  Ears and nose without any masses or lesions.  Mouth  is pink and moist Heart: regular, rate, and rhythm.  Normal s1,s2. No obvious murmurs, gallops, or rubs noted.  Palpable radial pulses bilaterally Lungs: CTAB, no wheezes, rhonchi, or rales noted.  Respiratory effort nonlabored Abd: soft, mildly tender in RUQ, ND, +BS, no masses, hernias, or organomegaly Psych: A&Ox3 with an appropriate affect.   Results for orders placed or performed during the hospital encounter of 08/07/24 (from the past 48 hours)  Lipase, blood     Status: None   Collection Time: 08/07/24  3:35 AM  Result Value Ref Range   Lipase 31 11 - 51 U/L    Comment: Performed at [facility], [address] [place], [place] [address]  Comprehensive metabolic panel     Status: Abnormal   Collection Time: 08/07/24  3:35 AM  Result Value Ref Range   Sodium 138 135 - 145 mmol/L   Potassium 3.3 (L) 3.5 - 5.1 mmol/L   Chloride 105 98 - 111 mmol/L   CO2 20 (L) 22 - 32 mmol/L   Glucose, Bld 134 (H) 70 - 99 mg/dL    Comment: Glucose reference range applies only to samples taken after fasting for at least 8 hours.   BUN 9 6 - 20 mg/dL   Creatinine, Ser 9.49 0.44 - 1.00 mg/dL   Calcium 9.0 8.9 - 89.6 mg/dL   Total Protein 7.5 6.5 - 8.1 g/dL   Albumin 4.1 3.5 - 5.0 g/dL  AST 23 15 - 41 U/L   ALT 30 0 - 44 U/L   Alkaline Phosphatase 79 38 - 126 U/L   Total Bilirubin 0.6 0.0 - 1.2 mg/dL   GFR, Estimated >39 >39 mL/min    Comment: (NOTE) Calculated using the CKD-EPI Creatinine Equation (2021)    Anion gap 13 5 - 15    Comment: Performed at [facility], [address] [place], [place] [address]  CBC     Status: Abnormal   Collection Time: 08/07/24  3:35 AM  Result Value Ref Range   WBC 14.4 (H) 4.0 - 10.5 K/uL   RBC 5.36 (H) 3.87 - 5.11 MIL/uL   Hemoglobin 13.4 12.0 - 15.0 g/dL   HCT 56.2 63.9 - 53.9 %   MCV 81.5 80.0 - 100.0 fL   MCH 25.0 (L) 26.0 - 34.0 pg   MCHC 30.7 30.0 - 36.0 g/dL   RDW 86.1 88.4 - 84.4 %   Platelets 382 150 -  400 K/uL   nRBC 0.0 0.0 - 0.2 %    Comment: Performed at [facility], [address] [place], [place] [address]  hCG, serum, qualitative     Status: None   Collection Time: 08/07/24  3:35 AM  Result Value Ref Range   Preg, Serum NEGATIVE NEGATIVE    Comment:        THE SENSITIVITY OF THIS METHODOLOGY IS >10 mIU/mL. Performed at [facility], [address], [place], [place] [address]   Urinalysis, Routine w reflex microscopic -Urine, Clean Catch     Status: Abnormal   Collection Time: 08/07/24  5:10 AM  Result Value Ref Range   Color, Urine STRAW (A) YELLOW   APPearance CLEAR CLEAR   Specific Gravity, Urine 1.006 1.005 - 1.030   pH 7.0 5.0 - 8.0   Glucose, UA NEGATIVE NEGATIVE mg/dL   Hgb urine dipstick MODERATE (A) NEGATIVE   Bilirubin Urine NEGATIVE NEGATIVE   Ketones, ur NEGATIVE NEGATIVE mg/dL   Protein, ur NEGATIVE NEGATIVE mg/dL   Nitrite NEGATIVE NEGATIVE   Leukocytes,Ua NEGATIVE NEGATIVE   RBC / HPF 6-10 0 - 5 RBC/hpf   WBC, UA 0-5 0 - 5 WBC/hpf   Bacteria, UA RARE (A) NONE SEEN   Squamous Epithelial / HPF 0-5 0 - 5 /HPF   Mucus PRESENT     Comment: Performed at [facility], [address], [place], [place] [address]   US  Abdomen Limited RUQ (LIVER/GB) Result Date: 08/07/2024 CLINICAL DATA:  [id number].  Right upper quadrant pain. EXAM: ULTRASOUND ABDOMEN LIMITED RIGHT UPPER QUADRANT COMPARISON:  None. FINDINGS: Gallbladder: The gallbladder is dilated up to 12 cm in length. There are scattered layering stones and sludge, largest individual stone 1.2 cm. There is a thickened free wall measuring 4.1 cm and a positive sonographic Murphy's sign. Findings worrisome for acute cholecystitis. No pericholecystic fluid is seen. Common bile duct: Diameter: Prominent for age measuring 7.5 mm. No intrahepatic bile duct dilatation. Liver: No focal lesion identified. Parenchymal echogenicity is increased consistent with steatosis.  Portal vein is patent on color Doppler imaging with normal direction of blood flow towards the liver. Other: None. IMPRESSION: 1. Cholelithiasis with findings worrisome for acute cholecystitis. Surgical consultation recommended. 2. Prominent common bile duct for age measuring 7.5 mm. No intrahepatic bile duct dilatation. 3. Hepatic steatosis. Electronically Signed   By: Francis Quam M.D.   On: 08/07/2024 05:49      Assessment/Plan Cholecystitis with a  mildly prominent CBD on US , 7.64mm The patient has been seen, examined, labs, vitals, and imaging personally reviewed.  She has cholecystitis on exam and imaging.  We will plan to proceed with a lap chole with possible IOC and ICG dye.  I have explained the procedure, risks, and aftercare of cholecystectomy.  Risks include but are not limited to bleeding, infection, wound problems, anesthesia, diarrhea, bile leak, injury to common bile duct/liver/intestine.  She seems to understand and agrees to proceed.  We also discussed post op expectations and instructions.  Her husband was present for this conversation as well.  All questions answered.  FEN - NPO VTE - SCDs ID - Rocephin /flagyl  Admit - obs, likely home from PACU  I reviewed ED provider notes, last 24 h vitals and pain scores, last 48 h intake and output, last 24 h labs and trends, and last 24 h imaging results.  Burnard FORBES Banter, [facility] 08/07/2024, 9:22 AM Please see Amion for pager number during day hours 7:00am-4:30pm or 7:00am -11:30am on weekends

## 2024-08-07 NOTE — Discharge Instructions (Signed)
 CIRUGIA LAPAROSCOPICA: INSTRUCCIONES DE POST OPERATORIO.  Revise siempre los documentos que le entreguen en el lugar donde se ha hecho la [place].  SI USTED NECESITA DOCUMENTOS DE INCAPACIDAD (DISABLE) O DE PERMISO FAMILAR (FAMILY LEAVE) NECESITA TRAERLOS A LA OFICINA PARA QUE SEAN PROCESADOS. NO  SE LOS DE A SU DOCTOR. A su alta del hospital se le dara una receta para Human resources officer. Tomela como ha sido recetada, si la necesita. Si no la necesita puede tomar, Acetaminofen (Tylenol) o Ibuprofen (Advil) para aliviar dolor moderado. Continue tomando el resto de sus medicinas. Si necesita rellenar la receta, llame a la farmacia. ellos contactan a nuestra oficina pidiendo autorizacion. Este tipo de receta no pueden ser [employer] de las  5pm o [employer] fines de [place]. Con relacion a la dieta: debe ser [employer] primeros dias despues que llege a la casa. Ejemplo: sopas y galleticas. Tome bastante liquido esos dias. [employer] de los pacientes padecen de inflamacion y cambio de coloracion de la piel alrededor de las incisiones. esto toma dias en resolver.  pnerse una bolsa de hielo en el area affectada ayuda..  Es comun tambien tener un poco de estrenimiento si esta tomado medicinas para Chief Technology Officer. incremente la cantidad de liquidos a tomar y Engineer, production (Colace) esto previene el problema. Si ya tiene estrenimiento, es Designer, jewellery no ha defecado en 48 horas, puede tomar un laxativo (Milk of Magnesia or Miralax) uselo como el paquete le explica.  A menos que se le diga algo diferente. Remueva el bendaje a las 24-48 horas despues dela [place]. y puede banarse en la ducha sin ningun problema. usted puede tener steri-strips (pequenas curitas transparentes en la piel puesta encima de la incision)  Estas banditas strips should be left on the skin for 7-10 days.   Si su cirujano puso pegamento encima de la incision usted puede banarse bajo la ducha en 24 horas. Este pegamento empezara a caerse en las  proximas 2-3 semanas. Si le pusieron suturas o presillas (grapos) estos seran quitados en su proxima cita en la oficina. . ACTIVIDADES:  Puede hacer actividad ligera.  Como caminar , subir escaleras y poco a poco irlas incrementando tanto como las [place]. Puede tener relaciones sexuales cuando sea comfortable. No carge objetos pesados o haga esfuerzos que no sean aprovados por su doctor. Puede manejar en cuanto no esta tomando medicamentos fuertes (narcoticos) para Chief Technology Officer, pueda abrochar confortablemente el cinturon de seguridad, y pueda Personnel officer y usar los pedales de su vehiculo con seguridad. PUEDE REGRESAR A TRABAJAR  Debe ver a su doctor para una cita de seguimiento en 2-3 semanas despues de la [place].  OTRAS ISNSTRUCCIONES:___________________________________________________________________________________ Kiara Webster A SU MEDICO: FIEBRE mayor de  101.0 No produccion de [place]. Sangramiento continue de la herida Incremento de Engineer, mining, enrojecimientio o drenaje de la herida (incision) Incremento de dolor abdominal.  The clinic staff is available to answer your questions during regular business hours.  Please don't hesitate to call and ask to speak to one of the nurses for clinical concerns.  If you have a medical emergency, go to the nearest emergency room or call 911.  A surgeon from [facility] is always on call at the hospital. [address], [address], [place], [place]  [address] ? [address] [address], [place], [place]   [address] [phone] ? [phone] ? FAX [phone] Web site: [email or web address]

## 2024-08-07 NOTE — Anesthesia Procedure Notes (Signed)
 Procedure Name: Intubation Date/Time: 08/07/2024 9:53 AM  Performed by: [place], Corean BROCKS, CRNAPre-anesthesia Checklist: Patient identified, Emergency Drugs available, Suction available and Patient being monitored Patient Re-evaluated:Patient Re-evaluated prior to induction Oxygen Delivery Method: Circle system utilized Preoxygenation: Pre-oxygenation with 100% oxygen Induction Type: IV induction Ventilation: Mask ventilation without difficulty Laryngoscope Size: Mac and 3 Grade View: Grade I Tube type: Oral Tube size: 7.0 mm Number of attempts: 1 Airway Equipment and Method: Stylet and Oral airway Placement Confirmation: ETT inserted through vocal cords under direct vision, positive ETCO2 and breath sounds checked- equal and bilateral Secured at: 21 cm Tube secured with: Tape Dental Injury: Teeth and Oropharynx as per pre-operative assessment

## 2024-08-07 NOTE — ED Notes (Signed)
 RN at bedside. PT resting with husband and small child present. Pt denies any needs at this time.

## 2024-08-08 ENCOUNTER — Encounter (HOSPITAL_COMMUNITY): Payer: Self-pay | Admitting: General Surgery

## 2024-08-08 LAB — SURGICAL PATHOLOGY
